# Patient Record
Sex: Female | Born: 1970
Health system: Southern US, Community
[De-identification: ages and names within clinical notes are randomized; demographics above are authoritative.]

## PROBLEM LIST (undated history)

## (undated) DIAGNOSIS — R011 Cardiac murmur, unspecified: Secondary | ICD-10-CM

## (undated) DIAGNOSIS — R112 Nausea with vomiting, unspecified: Secondary | ICD-10-CM

## (undated) DIAGNOSIS — E785 Hyperlipidemia, unspecified: Secondary | ICD-10-CM

## (undated) DIAGNOSIS — I10 Essential (primary) hypertension: Secondary | ICD-10-CM

## (undated) DIAGNOSIS — H6690 Otitis media, unspecified, unspecified ear: Secondary | ICD-10-CM

## (undated) DIAGNOSIS — G809 Cerebral palsy, unspecified: Secondary | ICD-10-CM

## (undated) DIAGNOSIS — R51 Headache: Secondary | ICD-10-CM

## (undated) DIAGNOSIS — J302 Other seasonal allergic rhinitis: Secondary | ICD-10-CM

## (undated) DIAGNOSIS — K635 Polyp of colon: Secondary | ICD-10-CM

## (undated) DIAGNOSIS — F419 Anxiety disorder, unspecified: Secondary | ICD-10-CM

## (undated) DIAGNOSIS — Z9889 Other specified postprocedural states: Secondary | ICD-10-CM

## (undated) DIAGNOSIS — T7840XA Allergy, unspecified, initial encounter: Secondary | ICD-10-CM

## (undated) HISTORY — PX: HIP SURGERY: SHX245

## (undated) HISTORY — DX: Cerebral palsy, unspecified: G80.9

## (undated) HISTORY — DX: Allergy, unspecified, initial encounter: T78.40XA

## (undated) HISTORY — DX: Polyp of colon: K63.5

## (undated) HISTORY — PX: OTHER SURGICAL HISTORY: SHX169

## (undated) HISTORY — DX: Anxiety disorder, unspecified: F41.9

## (undated) HISTORY — DX: Hyperlipidemia, unspecified: E78.5

## (undated) HISTORY — DX: Other seasonal allergic rhinitis: J30.2

## (undated) HISTORY — DX: Cardiac murmur, unspecified: R01.1

## (undated) HISTORY — PX: EYE SURGERY: SHX253

## (undated) HISTORY — PX: TONSILLECTOMY: SUR1361

## (undated) HISTORY — DX: Otitis media, unspecified, unspecified ear: H66.90

## (undated) HISTORY — PX: INCISION AND DRAINAGE ABSCESS: SHX5864

## (undated) HISTORY — PX: ABDOMINAL HYSTERECTOMY: SHX81

## (undated) HISTORY — DX: Essential (primary) hypertension: I10

---

## 1998-02-15 ENCOUNTER — Other Ambulatory Visit: Admission: RE | Admit: 1998-02-15 | Discharge: 1998-02-15 | Payer: Self-pay | Admitting: Gynecology

## 2000-04-28 ENCOUNTER — Other Ambulatory Visit: Admission: RE | Admit: 2000-04-28 | Discharge: 2000-04-28 | Payer: Self-pay | Admitting: Gynecology

## 2001-06-16 ENCOUNTER — Other Ambulatory Visit: Admission: RE | Admit: 2001-06-16 | Discharge: 2001-06-16 | Payer: Self-pay | Admitting: Gynecology

## 2002-06-20 ENCOUNTER — Other Ambulatory Visit: Admission: RE | Admit: 2002-06-20 | Discharge: 2002-06-20 | Payer: Self-pay | Admitting: Gynecology

## 2003-06-26 ENCOUNTER — Other Ambulatory Visit: Admission: RE | Admit: 2003-06-26 | Discharge: 2003-06-26 | Payer: Self-pay | Admitting: Gynecology

## 2004-07-04 ENCOUNTER — Other Ambulatory Visit: Admission: RE | Admit: 2004-07-04 | Discharge: 2004-07-04 | Payer: Self-pay | Admitting: Gynecology

## 2005-07-09 ENCOUNTER — Other Ambulatory Visit: Admission: RE | Admit: 2005-07-09 | Discharge: 2005-07-09 | Payer: Self-pay | Admitting: Gynecology

## 2005-07-09 ENCOUNTER — Encounter: Admission: RE | Admit: 2005-07-09 | Discharge: 2005-07-09 | Payer: Self-pay | Admitting: Gynecology

## 2005-07-25 ENCOUNTER — Ambulatory Visit: Payer: Self-pay | Admitting: Internal Medicine

## 2006-08-24 ENCOUNTER — Other Ambulatory Visit: Admission: RE | Admit: 2006-08-24 | Discharge: 2006-08-24 | Payer: Self-pay | Admitting: Gynecology

## 2006-10-26 ENCOUNTER — Ambulatory Visit: Payer: Self-pay | Admitting: Internal Medicine

## 2006-10-26 DIAGNOSIS — G809 Cerebral palsy, unspecified: Secondary | ICD-10-CM | POA: Insufficient documentation

## 2006-10-26 DIAGNOSIS — R011 Cardiac murmur, unspecified: Secondary | ICD-10-CM

## 2006-10-26 DIAGNOSIS — D126 Benign neoplasm of colon, unspecified: Secondary | ICD-10-CM

## 2006-10-26 DIAGNOSIS — E785 Hyperlipidemia, unspecified: Secondary | ICD-10-CM

## 2006-10-26 DIAGNOSIS — I10 Essential (primary) hypertension: Secondary | ICD-10-CM

## 2007-11-16 ENCOUNTER — Encounter: Payer: Self-pay | Admitting: Internal Medicine

## 2007-11-23 ENCOUNTER — Telehealth (INDEPENDENT_AMBULATORY_CARE_PROVIDER_SITE_OTHER): Payer: Self-pay | Admitting: *Deleted

## 2007-12-03 ENCOUNTER — Ambulatory Visit: Payer: Self-pay | Admitting: Internal Medicine

## 2007-12-03 DIAGNOSIS — N3941 Urge incontinence: Secondary | ICD-10-CM | POA: Insufficient documentation

## 2007-12-03 DIAGNOSIS — N3946 Mixed incontinence: Secondary | ICD-10-CM | POA: Insufficient documentation

## 2007-12-20 ENCOUNTER — Encounter: Payer: Self-pay | Admitting: Internal Medicine

## 2008-05-16 ENCOUNTER — Ambulatory Visit: Payer: Self-pay | Admitting: Family Medicine

## 2008-05-16 ENCOUNTER — Ambulatory Visit: Payer: Self-pay | Admitting: Internal Medicine

## 2008-05-16 ENCOUNTER — Encounter (INDEPENDENT_AMBULATORY_CARE_PROVIDER_SITE_OTHER): Payer: Self-pay | Admitting: *Deleted

## 2008-05-17 ENCOUNTER — Telehealth (INDEPENDENT_AMBULATORY_CARE_PROVIDER_SITE_OTHER): Payer: Self-pay | Admitting: *Deleted

## 2008-05-18 ENCOUNTER — Telehealth: Payer: Self-pay | Admitting: Family Medicine

## 2009-01-31 ENCOUNTER — Encounter (INDEPENDENT_AMBULATORY_CARE_PROVIDER_SITE_OTHER): Payer: Self-pay | Admitting: *Deleted

## 2009-01-31 HISTORY — PX: LAPAROSCOPIC SUPRACERVICAL HYSTERECTOMY: SUR797

## 2009-02-27 ENCOUNTER — Ambulatory Visit (HOSPITAL_BASED_OUTPATIENT_CLINIC_OR_DEPARTMENT_OTHER): Admission: RE | Admit: 2009-02-27 | Discharge: 2009-02-28 | Payer: Self-pay | Admitting: Gynecology

## 2009-04-03 ENCOUNTER — Ambulatory Visit: Payer: Self-pay | Admitting: Internal Medicine

## 2009-04-03 DIAGNOSIS — H9209 Otalgia, unspecified ear: Secondary | ICD-10-CM | POA: Insufficient documentation

## 2009-04-06 LAB — CONVERTED CEMR LAB
ALT: 17 units/L (ref 0–35)
Basophils Relative: 0.9 % (ref 0.0–3.0)
Calcium: 9 mg/dL (ref 8.4–10.5)
Cholesterol: 156 mg/dL (ref 0–200)
Creatinine, Ser: 0.8 mg/dL (ref 0.4–1.2)
Eosinophils Absolute: 0.2 10*3/uL (ref 0.0–0.7)
Hemoglobin: 14.8 g/dL (ref 12.0–15.0)
Lymphocytes Relative: 24.6 % (ref 12.0–46.0)
Lymphs Abs: 1.7 10*3/uL (ref 0.7–4.0)
MCHC: 34.1 g/dL (ref 30.0–36.0)
MCV: 85.5 fL (ref 78.0–100.0)
Monocytes Absolute: 0.4 10*3/uL (ref 0.1–1.0)
Monocytes Relative: 5.1 % (ref 3.0–12.0)
Platelets: 231 10*3/uL (ref 150.0–400.0)
RDW: 11.5 % (ref 11.5–14.6)
Sodium: 141 meq/L (ref 135–145)
Total CHOL/HDL Ratio: 4
WBC: 7.1 10*3/uL (ref 4.5–10.5)

## 2009-08-17 ENCOUNTER — Ambulatory Visit: Payer: Self-pay | Admitting: Family Medicine

## 2009-08-17 DIAGNOSIS — F329 Major depressive disorder, single episode, unspecified: Secondary | ICD-10-CM

## 2009-08-17 DIAGNOSIS — R319 Hematuria, unspecified: Secondary | ICD-10-CM

## 2009-08-17 LAB — CONVERTED CEMR LAB
Bilirubin Urine: NEGATIVE
Ketones, urine, test strip: NEGATIVE
WBC Urine, dipstick: NEGATIVE

## 2009-11-08 ENCOUNTER — Ambulatory Visit: Payer: Self-pay | Admitting: Family Medicine

## 2009-11-08 DIAGNOSIS — G43009 Migraine without aura, not intractable, without status migrainosus: Secondary | ICD-10-CM

## 2009-11-15 ENCOUNTER — Ambulatory Visit: Payer: Self-pay | Admitting: Family Medicine

## 2009-11-15 DIAGNOSIS — H6690 Otitis media, unspecified, unspecified ear: Secondary | ICD-10-CM | POA: Insufficient documentation

## 2010-03-28 ENCOUNTER — Ambulatory Visit
Admission: RE | Admit: 2010-03-28 | Discharge: 2010-03-28 | Payer: Self-pay | Source: Home / Self Care | Attending: Family Medicine | Admitting: Family Medicine

## 2010-03-28 ENCOUNTER — Encounter (INDEPENDENT_AMBULATORY_CARE_PROVIDER_SITE_OTHER): Payer: Self-pay | Admitting: *Deleted

## 2010-03-28 ENCOUNTER — Other Ambulatory Visit: Payer: Self-pay | Admitting: Family Medicine

## 2010-03-28 LAB — CBC WITH DIFFERENTIAL/PLATELET
Basophils Relative: 1.6 % (ref 0.0–3.0)
HCT: 41.8 % (ref 36.0–46.0)
Hemoglobin: 14.4 g/dL (ref 12.0–15.0)
Lymphocytes Relative: 32.8 % (ref 12.0–46.0)
Monocytes Relative: 6.2 % (ref 3.0–12.0)
Neutro Abs: 2.7 10*3/uL (ref 1.4–7.7)
RBC: 4.89 Mil/uL (ref 3.87–5.11)
RDW: 12.2 % (ref 11.5–14.6)
WBC: 4.8 10*3/uL (ref 4.5–10.5)

## 2010-03-28 LAB — BASIC METABOLIC PANEL
Calcium: 8.7 mg/dL (ref 8.4–10.5)
Chloride: 105 mEq/L (ref 96–112)
Creatinine, Ser: 0.8 mg/dL (ref 0.4–1.2)
GFR: 79.48 mL/min (ref >60.00)
Potassium: 4.4 mEq/L (ref 3.5–5.1)
Sodium: 135 mEq/L (ref 135–145)

## 2010-03-28 LAB — HEPATIC FUNCTION PANEL
AST: 14 U/L (ref 0–37)
Bilirubin, Direct: 0.1 mg/dL (ref 0.0–0.3)

## 2010-03-28 LAB — LIPID PANEL
Cholesterol: 169 mg/dL (ref 0–200)
LDL Cholesterol: 104 mg/dL — ABNORMAL HIGH (ref 0–99)
Total CHOL/HDL Ratio: 3
Triglycerides: 50 mg/dL (ref 0.0–149.0)
VLDL: 10 mg/dL (ref 0.0–40.0)

## 2010-03-28 LAB — TSH: TSH: 2.32 u[IU]/mL (ref 0.35–5.50)

## 2010-04-04 NOTE — Letter (Signed)
Summary: Depression Questionnaire  Depression Questionnaire   Imported By: Lanelle Bal 08/28/2009 11:20:55  _____________________________________________________________________  External Attachment:    Type:   Image     Comment:   External Document

## 2010-04-04 NOTE — Assessment & Plan Note (Signed)
Summary: earache worse/cbs   Vital Signs:  Patient profile:   41 year old female Height:      67 inches Weight:      182 pounds Temp:     98.6 degrees F oral Pulse rate:   80 / minute BP sitting:   118 / 78  (left arm)  Vitals Entered By: Jeremy Johann CMA (November 15, 2009 1:34 PM) CC: pain in left ear   History of Present Illness: 40 yo woman here today for continued pain in L ear.  no pain w/ manipulation of pinna, pain behind L ear.  taking amox.  subjective fevers.  using sudafed, nasal spray, antihistamine as directed.  Current Medications (verified): 1)  Triamterene-Hctz 37.5-25 Mg  Tabs (Triamterene-Hctz) .... Half By Mouth Qd 2)  Aleve .Marland Kitchen.. 1t;qd 3)  Citalopram Hydrobromide 40 Mg Tabs (Citalopram Hydrobromide) .... Take One Tablet By Mouth Daily 4)  Amoxicillin 500 Mg Tabs (Amoxicillin) .Marland Kitchen.. 1 Tab By Mouth Two Times A Day X10 Days.  Take W/ Food. 5)  Flonase 50 Mcg/act Susp (Fluticasone Propionate) .... 2 Sprays Each Nostril Daily  Allergies (verified): 1)  ! Codeine  Review of Systems      See HPI  Physical Exam  General:  alert, well-developed, and well-nourished.   Head:  Normocephalic and atraumatic without obvious abnormalities. No apparent alopecia or balding. Eyes:  No corneal or conjunctival inflammation noted. EOMI. Perrla. Funduscopic exam benign, without hemorrhages, exudates or papilledema. Vision grossly normal. Ears:  L TM markedly retracted, R TM normal.  L OM improved- no longer erythematous or opaque fluid Nose:  marked turbinate edema, L>R Mouth:  + PND Lungs:  normal respiratory effort, no intercostal retractions, and no accessory muscle use.   Heart:  normal rate, regular rhythm, and no murmur.     Impression & Recommendations:  Problem # 1:  LOM (ICD-382.9) Assessment Unchanged pt completing course of amox.  infxn component seems to be resolving.  TM remains retracted.  most likely eustachian tube dysfxn vs serous otitis.  continue  plan as discussed at previous visit.  if no improvement will refer to ENT.  pt and mom express understanding and are in agreement. Her updated medication list for this problem includes:    Amoxicillin 500 Mg Tabs (Amoxicillin) .Marland Kitchen... 1 tab by mouth two times a day x10 days.  take w/ food.  Complete Medication List: 1)  Triamterene-hctz 37.5-25 Mg Tabs (Triamterene-hctz) .... Half by mouth qd 2)  Aleve  .Marland Kitchen.. 1t;qd 3)  Citalopram Hydrobromide 40 Mg Tabs (Citalopram hydrobromide) .... Take one tablet by mouth daily 4)  Amoxicillin 500 Mg Tabs (Amoxicillin) .Marland Kitchen.. 1 tab by mouth two times a day x10 days.  take w/ food. 5)  Flonase 50 Mcg/act Susp (Fluticasone propionate) .... 2 sprays each nostril daily  Patient Instructions: 1)  If no improvement in 1-2 weeks, call me and we'll send you to ENT 2)  Continue the Flonase, sudafed, claritin/zyrtec 3)  Finish the amoxicillin 4)  Call with any questions or concerns! 5)  Hang in there!!!

## 2010-04-04 NOTE — Assessment & Plan Note (Signed)
Summary: fu on bp/kdc   Vital Signs:  Patient profile:   40 year old female Height:      67 inches Weight:      193.6 pounds BMI:     30.43 Pulse rate:   62 / minute BP sitting:   110 / 70  Vitals Entered By: Dena Billet CC: rov Comments pt. complains of stomach pain, no other symptoms pt. complains of rt ear pain x 2days needs referral for colonoscopy   History of Present Illness: htn-- has lost wt, watching diet, ambulatory BPs good   high cholesterol--  diet improved, needs blood checked   complains of stomach pain since the hysterctomy, to see Dr Nicholas Lose  04-09-09, see ROS  pt. complains of L ear pain x 2days, see ROS  needs referral for colonoscopy, history of colon polyps many years ago  Allergies: 1)  ! Codeine  Past History:  Past Medical History: Reviewed history from 12/03/2007 and no changes required. Hyperlipidemia Hypertension CARDIAC MURMUR   COLONIC POLYPS   CEREBRAL PALSY    Past Surgical History: status-post hip  surgery bilaterally, eye surgery. Hysterectomy "Supracervical hysterectomy."  (01/31/2009)   Social History: Reviewed history from 12/03/2007 and no changes required. the patient is married to another handicapped person-- they are currently separated no children  Review of Systems       denies fever no nausea or vomiting no vaginal discharge or bleeding denies ear discharge, runny nose or sore throat no dysuria hematuria  Physical Exam  General:  alert, well-developed, and well-nourished.   Ears:  R ear normal and L ear normal.   Lungs:  normal respiratory effort, no intercostal retractions, and no accessory muscle use.   Heart:  normal rate, regular rhythm, and no murmur.   Abdomen:  soft, no distention, no masses, no guarding, and no rigidity.  slightly tender in the lower abdomen without mass or rebound   Impression & Recommendations:  Problem # 1:  HYPERTENSION (ICD-401.9) well controlled, continue with present care,  labs Her updated medication list for this problem includes:    Triamterene-hctz 37.5-25 Mg Tabs (Triamterene-hctz) ..... Half by mouth qd    Metoprolol Tartrate 100 Mg Tabs (Metoprolol tartrate) ..... Half tablet twice a day for one week, then one tablet twice a day    BP today: 110/70 Prior BP: 124/86 (05/16/2008)  Orders: Venipuncture (16109) TLB-BMP (Basic Metabolic Panel-BMET) (80048-METABOL) TLB-CBC Platelet - w/Differential (85025-CBCD)  Problem # 2:  HYPERLIPIDEMIA (ICD-272.4) history of high cholesterol, on diet only diet has improved lately labs  Orders: TLB-ALT (SGPT) (84460-ALT) TLB-AST (SGOT) (84450-SGOT) TLB-Lipid Panel (80061-LIPID)  Problem # 3:  HEALTH MAINTENANCE EXAM (ICD-V70.0)  history of personal colon polyps, re-refer to GI status post recent hysterectomy, she will have to be cleared by gynecology before the procedure  Orders: Gastroenterology Referral (GI)  Problem # 4:  OTALGIA (ICD-388.70) Assessment: New ear exam normal, symptoms mild, patient to call if symptoms increase or are not better in few days  Problem # 5:  abdominal pain likely postop pain to see Dr. Nicholas Lose soon, call them immediately if the pain is worse or if she has fever  Complete Medication List: 1)  Bcp  2)  Triamterene-hctz 37.5-25 Mg Tabs (Triamterene-hctz) .... Half by mouth qd 3)  Metoprolol Tartrate 100 Mg Tabs (Metoprolol tartrate) .... Half tablet twice a day for one week, then one tablet twice a day 4)  Aleve  .Marland Kitchen.. 1t;qd 5)  Sanctura 60mg   .... 1t;qd  Patient  Instructions: 1)  Please schedule a follow-up appointment in 6 months .    Immunization History:  Influenza Immunization History:    Influenza:  historical (12/01/2008)

## 2010-04-04 NOTE — Letter (Signed)
Summary: Work Dietitian at [employer]  [address] [place], [place] [address]   Phone: [phone]  Fax: [phone]    Today's Date: March 28, 2010  Name of Patient: Holly Saunders  The above named patient had a medical visit today at:  9:00am  Please take this into consideration when reviewing the time away from work/school.    Special Instructions:  [  X] None  [  ] To be off the remainder of today, returning to the normal work / school schedule tomorrow.  [  ] To be off until the next scheduled appointment on ______________________.  [  ] Other ________________________________________________________________ ________________________________________________________________________   Sincerely yours,   Doristine Devoid CMA

## 2010-04-04 NOTE — Assessment & Plan Note (Signed)
Summary: migraine?//ear pain//lch   Vital Signs:  Patient profile:   40 year old female Weight:      184 pounds BMI:     28.92 Temp:     98.6 degrees F oral BP sitting:   130 / 86  (left arm)  Vitals Entered By: Doristine Devoid CMA (November 08, 2009 3:57 PM) CC: migraines and L ear pain more tired along w/ weakness   History of Present Illness: 40 yo woman here today w/  1) L ear pain- sxs started 'a couple days ago'.  associated sore throat.  no fevers.  hx of recurrent ear infxns.  no nasal congestion.  mild cough.  + fatigue.  2) HA- had a 'really bad' HA last night.  no nausea.  HA improved currently.  3) poor sleep- able to fall asleep but having trouble staying asleep.  mood dramatically improved since starting Citalopram.  sleep issues were present before starting SSRI, pt reports this is unchanged.  will sleep well when takes 1 of mom's benzos.  Current Medications (verified): 1)  Triamterene-Hctz 37.5-25 Mg  Tabs (Triamterene-Hctz) .... Half By Mouth Qd 2)  Aleve .Marland Kitchen.. 1t;qd 3)  Citalopram Hydrobromide 40 Mg Tabs (Citalopram Hydrobromide) .... Take One Tablet By Mouth Daily 4)  Amoxicillin 500 Mg Tabs (Amoxicillin) .Marland Kitchen.. 1 Tab By Mouth Two Times A Day X10 Days.  Take W/ Food. 5)  Flonase 50 Mcg/act Susp (Fluticasone Propionate) .... 2 Sprays Each Nostril Daily  Allergies (verified): 1)  ! Codeine  Past History:  Past Medical History: Hyperlipidemia Hypertension CARDIAC MURMUR   COLONIC POLYPS   CEREBRAL PALSY  Recurrent ear infections Seasonal allergies   Review of Systems      See HPI  Physical Exam  General:  alert, well-developed, and well-nourished.   Head:  Normocephalic and atraumatic without obvious abnormalities. No apparent alopecia or balding. no TTP over sinuses Eyes:  no injxn or inflammation Ears:  L TM dull, mildly erythematous, w/ opaque fluid behind it Nose:  marked turbinate edema Mouth:  + PND Neck:  no masses and no thyromegaly.     Lungs:  normal respiratory effort, no intercostal retractions, and no accessory muscle use.   Heart:  normal rate, regular rhythm, and no murmur.   Psych:  very worked up in office about her depression- feels this is something that should be better.  doesn't want to be depressed but doesn't know what to do.   Impression & Recommendations:  Problem # 1:  LOM (ICD-382.9) Assessment New  pt's exam and hx consistent w/ L OM.  start amox.  likely the congestion has an allergy component- start Flonase, continue OTC antihistamine, add sudafed.  reviewed supportive care and red flags that should prompt return.  Pt expresses understanding and is in agreement w/ this plan. Her updated medication list for this problem includes:    Amoxicillin 500 Mg Tabs (Amoxicillin) .Marland Kitchen... 1 tab by mouth two times a day x10 days.  take w/ food.  Orders: Prescription Created Electronically [phone])  Problem # 2:  DEPRESSIVE DISORDER (ICD-311) Assessment: Improved pt and mom report sxs much improved since starting citalopram.  still w/ sleep issues.  pt to continue citalopram but will increase dose.  will follow closely. Her updated medication list for this problem includes:    Citalopram Hydrobromide 40 Mg Tabs (Citalopram hydrobromide) .Marland Kitchen... Take one tablet by mouth daily  Problem # 3:  HEADACHE (ICD-784.0) Assessment: New most likely due to L OM.  no  red flags on hx or PE.  reviewed supportive care and red flags that should prompt return.  Pt expresses understanding and is in agreement w/ this plan.  Complete Medication List: 1)  Triamterene-hctz 37.5-25 Mg Tabs (Triamterene-hctz) .... Half by mouth qd 2)  Aleve  .Marland Kitchen.. 1t;qd 3)  Citalopram Hydrobromide 40 Mg Tabs (Citalopram hydrobromide) .... Take one tablet by mouth daily 4)  Amoxicillin 500 Mg Tabs (Amoxicillin) .Marland Kitchen.. 1 tab by mouth two times a day x10 days.  take w/ food. 5)  Flonase 50 Mcg/act Susp (Fluticasone propionate) .... 2 sprays each nostril  daily  Patient Instructions: 1)  Follow up in 1 month to follow up your depression 2)  Your headache is most likely due to your ear infection 3)  Take the Amoxicillin as directed for your ear infection- take w/ food to avoid upset stomach 4)  Use the Nasonex to decrease your allergy congestion 5)  Increase the Citalopram to 40mg  daily- you have a new prescription  waiting for you 6)  Your poor sleep is most likely due to the depression 7)  Hang in there!! Prescriptions: FLONASE 50 MCG/ACT SUSP (FLUTICASONE PROPIONATE) 2 sprays each nostril daily  #1 x 3   Entered and Authorized by:   Neena Rhymes MD   Signed by:   Neena Rhymes MD on 11/08/2009   Method used:   Electronically to        [facility] # [phone]* (retail)       [address] [place], [place]  [address]       Ph: [phone]       Fax: [phone]   RxID:   [id number] AMOXICILLIN 500 MG TABS (AMOXICILLIN) 1 tab by mouth two times a day x10 days.  take w/ food.  #20 x 0   Entered and Authorized by:   Neena Rhymes MD   Signed by:   Neena Rhymes MD on 11/08/2009   Method used:   Electronically to        [facility] # [phone]* (retail)       [address] [place], [place]  [address]       Ph: [phone]       Fax: [phone]   RxID:   [id number] TRIAMTERENE-HCTZ 37.5-25 MG  TABS (TRIAMTERENE-HCTZ) half by mouth QD  #15 x 6   Entered and Authorized by:   Neena Rhymes MD   Signed by:   Neena Rhymes MD on 11/08/2009   Method used:   Electronically to        [facility] # [phone]* (retail)       [address] [place], [place]  [address]       Ph: [phone]       Fax: [phone]   RxID:   [id number] CITALOPRAM HYDROBROMIDE 40 MG TABS (CITALOPRAM HYDROBROMIDE) take one tablet by mouth daily  #30 x 3   Entered and Authorized by:   Neena Rhymes MD   Signed by:   Neena Rhymes MD  on 11/08/2009   Method used:   Electronically to        [facility] [facility] [address] [phone]* (retail)       [address]       [place], [place]  [address]       Ph: [phone]       Fax: [phone]   RxID:   [id number]

## 2010-04-04 NOTE — Assessment & Plan Note (Signed)
Summary: depression//kn   Vital Signs:  Patient profile:   40 year old female Height:      67 inches Weight:      193 pounds BMI:     30.34 O2 Sat:      96 % on Room air Pulse rate:   70 / minute BP sitting:   120 / 74  (left arm) Cuff size:   large  Vitals Entered By: Payton Spark CMA (August 17, 2009 3:22 PM)  O2 Flow:  Room air CC: Depression   History of Present Illness: 40 yo woman here today for depression.  PHQ score of 15.  pt reports difficulty sleeping, will become tearful, irritable.  eating poorly.  going through divorce, having difficulty at work b/c ex is calling work and talking w/ a Radio broadcast assistant.  has been in counseling.  going to court for alimony.  also notes 2 days last week of blood on the toilet tissue when wiping.  no dysuria, frequency, urgency.  s/p hysterectomy but ovaries remain.  hx of ovarian cysts.  no pain.  no persistant sxs.  Current Medications (verified): 1)  Triamterene-Hctz 37.5-25 Mg  Tabs (Triamterene-Hctz) .... Half By Mouth Qd 2)  Metoprolol Tartrate 100 Mg  Tabs (Metoprolol Tartrate) .Marland Kitchen.. 1 By Mouth Bid 3)  Aleve .Marland Kitchen.. 1t;qd  Allergies (verified): 1)  ! Codeine  Past History:  Past Medical History: Reviewed history from 12/03/2007 and no changes required. Hyperlipidemia Hypertension CARDIAC MURMUR   COLONIC POLYPS   CEREBRAL PALSY    Past Surgical History: Reviewed history from 04/03/2009 and no changes required. status-post hip  surgery bilaterally, eye surgery. Hysterectomy "Supracervical hysterectomy."  (01/31/2009)   Social History: divorced no children  Review of Systems      See HPI  Physical Exam  General:  alert, well-developed, and well-nourished.   Abdomen:  no suprapubic or CVA tenderness Psych:  Cognition and judgment appear intact. Alert and cooperative with normal attention span and concentration. No apparent delusions, illusions, hallucinations   Impression & Recommendations:  Problem # 1:  DEPRESSIVE  DISORDER (ICD-311) Assessment New  PHQ score places pt in severely depressed category.  recommended therapy and will start SSRI.  as SSRIs all interact w/ metoprolol will have pt stop metoprolol and reassess BP at next visit.  pt denies SI/HI Her updated medication list for this problem includes:    Citalopram Hydrobromide 20 Mg Tabs (Citalopram hydrobromide) .Marland Kitchen... Take one tablet by mouth daily  Orders: Prescription Created Electronically [phone])  Problem # 2:  HEMATURIA (ICD-599.70) Assessment: New UA completely clear today.  possible that blood was from a ruptured ovarian cyst.  if sxs return will evaluate w/ pelvic US or urology eval.  Complete Medication List: 1)  Triamterene-hctz 37.5-25 Mg Tabs (Triamterene-hctz) .... Half by mouth qd 2)  Aleve  .Marland Kitchen.. 1t;qd 3)  Citalopram Hydrobromide 20 Mg Tabs (Citalopram hydrobromide) .... Take one tablet by mouth daily  Other Orders: UA Dipstick w/o Micro (automated)  (81003)  Patient Instructions: 1)  Please schedule a follow-up appointment in 1 month to recheck mood and blood pressure. 2)  STOP the metoprolol 3)  START the citalopram 4)  Please consider counseling to help you deal with all of this 5)  Call with any questions or concerns 6)  Hang in there!!  Prescriptions: CITALOPRAM HYDROBROMIDE 20 MG TABS (CITALOPRAM HYDROBROMIDE) take one tablet by mouth daily  #30 x 3   Entered and Authorized by:   Neena Rhymes MD   Signed by:  Neena Rhymes MD on 08/17/2009   Method used:   Electronically to        [facility] [phone]* (retail)       [address]       [place], [place]  [address]       Ph: [phone]       Fax: [phone]   RxID:   [id number]   Laboratory Results   Urine Tests    Routine Urinalysis   Color: yellow Appearance: Clear Glucose: negative   (Normal Range: Negative) Bilirubin: negative   (Normal Range: Negative) Ketone: negative   (Normal Range: Negative) Spec. Gravity: 1.010    (Normal Range: 1.003-1.035) Blood: negative   (Normal Range: Negative) pH: 5.0   (Normal Range: 5.0-8.0) Protein: negative   (Normal Range: Negative) Urobilinogen: 0.2   (Normal Range: 0-1) Nitrite: negative   (Normal Range: Negative) Leukocyte Esterace: negative   (Normal Range: Negative)

## 2010-04-10 NOTE — Assessment & Plan Note (Signed)
Summary: rov possibly needs bloodwork/nta   Vital Signs:  Patient profile:   40 year old female Weight:      188 pounds BMI:     29.55 Pulse rate:   70 / minute BP sitting:   120 / 80  (left arm)  Vitals Entered By: Doristine Devoid CMA (March 28, 2010 8:48 AM) CC: f/u and fasting labs    History of Present Illness: 40 yo woman here today for  1) Hyperlipidemia- due for labs.  not currently on meds.  attempting to lose weight.  has made great progress since 2008 ( ~40lbs)  2) Insomnia- taking her depression meds regularly but still waking up around 3:30 and is unable to get back to sleep.  stressed about starting new job.  has been crying again.  sxs started again at Christmas when she thought she was losing her job.  pt crying in office- 'maybe i haven't really dealt w/ any of this stuff'.  stuff includes divorce, cousin's death (they were very close), dad's stroke (fears being left alone), work issues.  will try and express her feelings but then apologize b/c she feels they're 'not coming out right'.  embarrassed by her tears, apologizes for these also.  has not had counseling.  3) HTN- pt would like to stop BP meds if possible.  denies CP, SOB, edema.    Current Medications (verified): 1)  Triamterene-Hctz 37.5-25 Mg  Tabs (Triamterene-Hctz) .... Half By Mouth Qd 2)  Aleve .Marland Kitchen.. 1t;qd 3)  Citalopram Hydrobromide 40 Mg Tabs (Citalopram Hydrobromide) .... Take One Tablet By Mouth Daily 4)  Flonase 50 Mcg/act Susp (Fluticasone Propionate) .... 2 Sprays Each Nostril Daily  Allergies (verified): 1)  ! Codeine  Past History:  Past medical, surgical, family and social histories (including risk factors) reviewed for relevance to current acute and chronic problems.  Past Medical History: Reviewed history from 11/08/2009 and no changes required. Hyperlipidemia Hypertension CARDIAC MURMUR   COLONIC POLYPS   CEREBRAL PALSY  Recurrent ear infections Seasonal allergies   Past  Surgical History: Reviewed history from 04/03/2009 and no changes required. status-post hip  surgery bilaterally, eye surgery. Hysterectomy "Supracervical hysterectomy."  (01/31/2009)   Family History: Reviewed history from 10/26/2006 and no changes required. no history of breast or colon cancer. No history of heart disease. Mother has hypertension, high cholesterol and colon polyps.  Social History: Reviewed history from 08/17/2009 and no changes required. divorced no children  Review of Systems      See HPI  Physical Exam  General:  tearful throughout visit Neck:  no masses and no thyromegaly.   Lungs:  normal respiratory effort, no intercostal retractions, and no accessory muscle use.   Heart:  normal rate, regular rhythm, and no murmur.   Psych:  tearful, apologetic for tears.  unable to put her feelings into 'the right words'.   Impression & Recommendations:  Problem # 1:  DEPRESSIVE DISORDER (ICD-311) Assessment Deteriorated pt obviously upset today.  in talking w/ her it is clear that she has not come to terms w/ many recent events- despite her thinking that she has.  discussed w/ both pt and her mother that increasing or changing meds will only go so far- that she must deal w/ these emotions that she's been suppressing or things will not improve.  names and #s of counselors provided.  total time spent w/ pt- 28 minutes, >50% spent counseling. Her updated medication list for this problem includes:    Citalopram Hydrobromide 40  Mg Tabs (Citalopram hydrobromide) .Marland Kitchen... Take one tablet by mouth daily  Problem # 2:  HYPERLIPIDEMIA (ICD-272.4) Assessment: Unchanged due for labs.  start meds if needed. Orders: Venipuncture (11914) Specimen Handling (78295) TLB-Lipid Panel (80061-LIPID) TLB-Hepatic/Liver Function Pnl (80076-HEPATIC)  Problem # 3:  HYPERTENSION (ICD-401.9) Assessment: Unchanged hold BP meds and re-assess at upcoming CPE.  if BP increases will need to  start meds.  Pt expresses understanding and is in agreement w/ this plan. Her updated medication list for this problem includes:    Triamterene-hctz 37.5-25 Mg Tabs (Triamterene-hctz) ..... Half by mouth qd  Orders: Specimen Handling (62130) TLB-BMP (Basic Metabolic Panel-BMET) (80048-METABOL) TLB-CBC Platelet - w/Differential (85025-CBCD) TLB-TSH (Thyroid Stimulating Hormone) (84443-TSH)  Complete Medication List: 1)  Triamterene-hctz 37.5-25 Mg Tabs (Triamterene-hctz) .... Half by mouth qd 2)  Aleve  .Marland Kitchen.. 1t;qd 3)  Citalopram Hydrobromide 40 Mg Tabs (Citalopram hydrobromide) .... Take one tablet by mouth daily 4)  Flonase 50 Mcg/act Susp (Fluticasone propionate) .... 2 sprays each nostril daily  Patient Instructions: 1)  Schedule your complete physical at your convenience 2)  We'll notify you of your lab results 3)  Please start counseling- this is very important!  You won't sleep or feel better until you deal with some of these emotions! 4)  Hang in there!   Orders Added: 1)  Venipuncture [86578] 2)  Specimen Handling [99000] 3)  TLB-Lipid Panel [80061-LIPID] 4)  TLB-Hepatic/Liver Function Pnl [80076-HEPATIC] 5)  TLB-BMP (Basic Metabolic Panel-BMET) [80048-METABOL] 6)  TLB-CBC Platelet - w/Differential [85025-CBCD] 7)  TLB-TSH (Thyroid Stimulating Hormone) [84443-TSH] 8)  Est. Patient Level IV [46962]

## 2010-06-03 LAB — HEMOGLOBIN AND HEMATOCRIT, BLOOD: Hemoglobin: 13.4 g/dL (ref 12.0–15.0)

## 2010-06-08 ENCOUNTER — Other Ambulatory Visit: Payer: Self-pay | Admitting: Family Medicine

## 2010-06-10 NOTE — Telephone Encounter (Signed)
Last ov- 03/28/10, last filled 11/08/09 30 x 3.

## 2010-08-13 ENCOUNTER — Encounter: Payer: Self-pay | Admitting: Family Medicine

## 2010-08-16 ENCOUNTER — Ambulatory Visit: Payer: Self-pay | Admitting: Family Medicine

## 2011-03-05 ENCOUNTER — Ambulatory Visit (INDEPENDENT_AMBULATORY_CARE_PROVIDER_SITE_OTHER): Payer: Medicare Other | Admitting: Family Medicine

## 2011-03-05 ENCOUNTER — Encounter: Payer: Self-pay | Admitting: Family Medicine

## 2011-03-05 DIAGNOSIS — H6690 Otitis media, unspecified, unspecified ear: Secondary | ICD-10-CM

## 2011-03-05 DIAGNOSIS — J329 Chronic sinusitis, unspecified: Secondary | ICD-10-CM

## 2011-03-05 MED ORDER — PROMETHAZINE-DM 6.25-15 MG/5ML PO SYRP: 5.0000 mL | ORAL_SOLUTION | Freq: Four times a day (QID) | ORAL | Status: AC | PRN

## 2011-03-05 MED ORDER — BENZONATATE 200 MG PO CAPS: 200.0000 mg | ORAL_CAPSULE | Freq: Three times a day (TID) | ORAL | Status: AC | PRN

## 2011-03-05 MED ORDER — AMOXICILLIN 875 MG PO TABS: 875.0000 mg | ORAL_TABLET | Freq: Two times a day (BID) | ORAL | Status: AC

## 2011-03-05 NOTE — Patient Instructions (Signed)
This is a sinus/ear infection Start the Amoxicillin as directed- take w/ food to avoid upset stomach Use the Tessalon for daytime cough and syrup for night time Add Mucinex to thin your congestion Drink plenty of fluids REST! Hang in there!!! Happy New Year!

## 2011-03-05 NOTE — Progress Notes (Signed)
  Subjective:    Patient ID: Holly Saunders, female    DOB: October 26, 1970, 41 y.o.   MRN: [id number]  HPI Cough- sxs started early Monday.  Cough is productive white/yellow sputum.  No fevers.  Alternating chills and sweats.  + nasal congestion.  L ear pain.  Mild facial pressure.  + sick contacts.  + sore throat.   Review of Systems For ROS see HPI     Objective:   Physical Exam  Vitals reviewed. Constitutional: She appears well-developed and well-nourished. No distress.  HENT:  Head: Normocephalic and atraumatic.  Right Ear: Tympanic membrane normal.  Left Ear: Tympanic membrane is injected, scarred, erythematous and bulging.  Nose: Mucosal edema and rhinorrhea present. Right sinus exhibits maxillary sinus tenderness and frontal sinus tenderness. Left sinus exhibits maxillary sinus tenderness and frontal sinus tenderness.  Mouth/Throat: Uvula is midline and mucous membranes are normal. Posterior oropharyngeal erythema present. No oropharyngeal exudate.  Eyes: Conjunctivae and EOM are normal. Pupils are equal, round, and reactive to light.  Neck: Normal range of motion. Neck supple.  Cardiovascular: Normal rate, regular rhythm and normal heart sounds.   Pulmonary/Chest: Effort normal and breath sounds normal. No respiratory distress. She has no wheezes.  Lymphadenopathy:    She has no cervical adenopathy.          Assessment & Plan:

## 2011-03-16 NOTE — Assessment & Plan Note (Signed)
New.  Pt's hx and PE consistent w/ infxn.  Start abx.  Reviewed supportive care and red flags that should prompt return.  Pt expressed understanding and is in agreement w/ plan.

## 2011-03-16 NOTE — Assessment & Plan Note (Signed)
Recurrent.  Start abx.  Reviewed supportive care and red flags that should prompt return.  Pt expressed understanding and is in agreement w/ plan.  

## 2011-05-28 ENCOUNTER — Encounter: Payer: Self-pay | Admitting: Internal Medicine

## 2011-05-28 ENCOUNTER — Ambulatory Visit (INDEPENDENT_AMBULATORY_CARE_PROVIDER_SITE_OTHER): Payer: Medicare Other | Admitting: Internal Medicine

## 2011-05-28 VITALS — BP 124/86 | HR 76 | Temp 98.9°F | Wt 197.8 lb

## 2011-05-28 DIAGNOSIS — J209 Acute bronchitis, unspecified: Secondary | ICD-10-CM

## 2011-05-28 DIAGNOSIS — J029 Acute pharyngitis, unspecified: Secondary | ICD-10-CM

## 2011-05-28 DIAGNOSIS — J069 Acute upper respiratory infection, unspecified: Secondary | ICD-10-CM

## 2011-05-28 MED ORDER — CEFUROXIME AXETIL 500 MG PO TABS: 500.0000 mg | ORAL_TABLET | Freq: Two times a day (BID) | ORAL | Status: AC

## 2011-05-28 NOTE — Patient Instructions (Signed)
Plain Mucinex for thick secretions ;force NON dairy fluids . Use a Neti pot daily as needed for sinus congestion. Nasal cleansing in the shower as discussed. Make sure that all residual soap is removed to prevent irritation.Fluticasone  1 spray in each nostril twice a day as needed. Use the "crossover" technique as discussed

## 2011-05-28 NOTE — Progress Notes (Signed)
  Subjective:    Patient ID: Holly Saunders, female    DOB: Jan 19, 1971, 41 y.o.   MRN: [id number]  HPI Respiratory tract infection Onset/symptoms:05/25/11 as ear pressure Exposures (illness/environmental/extrinsic):no Progression of symptoms:to head congestion with headache Treatments/response:Nyquil , Dayquil, ASA with slight benefit Present symptoms: Fever/chills/sweats:no Frontal headache:yes Facial pain:yes Nasal purulence:clear Sore throat:yes Dental pain:no Lymphadenopathy:no Wheezing/shortness of breath:no Cough/sputum/hemoptysis:yellow sputum Associated extrinsic/allergic symptoms:itchy eyes/ sneezing:yes Past medical history: Seasonal allergies: yes/asthma:no Smoking history:never          Review of Systems     Objective:   Physical Exam General appearance:good health ;well nourished; no acute distress or increased work of breathing is present.  No  lymphadenopathy about the head, neck, or axilla noted.   Eyes: No conjunctival inflammation or lid edema is present.   Ears:  External ear exam shows no significant lesions or deformities.  Otoscopic examination reveals clear canals, tympanic membranes are intact bilaterally & dull without bulging, retraction, inflammation or discharge.  Nose:  External nasal examination shows no deformity or inflammation. Nasal mucosa are dry without lesions or exudates. No septal dislocation or deviation.No obstruction to airflow.   Oral exam: Dental hygiene is good; lips and gums are healthy appearing.There is no oropharyngeal erythema or exudate noted but oropharynx is crowded ( Mother denies sleep apnea).   Neck:  No deformities, thyromegaly, masses but some  tenderness noted to palpation.    Heart:  Normal rate and regular rhythm. S1 and S2 normal without gallop,  click, rub .Grade 1/6 systolic murmur  Lungs:Chest clear to auscultation; no wheezes, rhonchi,rales ,or rubs present.No increased work of breathing.    Extremities:  No  cyanosis, edema, or clubbing  noted    Skin: Warm & dry           Assessment & Plan:  #1 acute bronchitis w/o bronchospasm #2 URI Plan: See orders and recommendations

## 2011-06-17 ENCOUNTER — Telehealth: Payer: Self-pay | Admitting: Internal Medicine

## 2011-06-17 NOTE — Telephone Encounter (Signed)
Pt's mother called requesting that we refill citalopram 40mg  for the pt. They have called [facility] several times, but I don't see where we received a request.

## 2011-06-17 NOTE — Telephone Encounter (Signed)
.  left message to have patient return my call to clarify whom her PCP is per noted pt has seen multiple providers in practice and unable to determine,

## 2011-06-18 MED ORDER — CITALOPRAM HYDROBROMIDE 40 MG PO TABS: 40.0000 mg | ORAL_TABLET | Freq: Every day | ORAL | Status: DC

## 2011-06-18 NOTE — Telephone Encounter (Signed)
Called pt mother number noted in designated party information as  [phone] per all other numbers noted disconnected, changed home number in chart and spoke to mother to verify pt rx needs to be sent to [facility] in [place], sent medication to [facility] via escribe per MD Beverely Low verbal order to do so

## 2011-07-08 ENCOUNTER — Ambulatory Visit (INDEPENDENT_AMBULATORY_CARE_PROVIDER_SITE_OTHER): Payer: Medicare Other | Admitting: Family Medicine

## 2011-07-08 ENCOUNTER — Encounter: Payer: Self-pay | Admitting: Family Medicine

## 2011-07-08 VITALS — BP 125/82 | HR 73 | Temp 98.3°F | Ht 66.0 in | Wt 199.6 lb

## 2011-07-08 DIAGNOSIS — R1011 Right upper quadrant pain: Secondary | ICD-10-CM | POA: Insufficient documentation

## 2011-07-08 DIAGNOSIS — R51 Headache: Secondary | ICD-10-CM

## 2011-07-08 DIAGNOSIS — R110 Nausea: Secondary | ICD-10-CM

## 2011-07-08 MED ORDER — PROMETHAZINE HCL 25 MG/ML IJ SOLN
25.0000 mg | Freq: Once | INTRAMUSCULAR | Status: AC
  Administered 2011-07-08: 25 mg via INTRAMUSCULAR

## 2011-07-08 NOTE — Progress Notes (Signed)
  Subjective:    Patient ID: Holly Saunders, female    DOB: 11-23-1970, 41 y.o.   MRN: [id number]  HPI Vomiting/abdominal pain- developed epigastric abdominal pain yesterday.  No fevers.  Last vomited at 3:30 am.  Mom reports epigastric pain has been ongoing x1 yr.  Cannot tolerate spicy foods- pain will last 2-3 days.  Starting to occur more frequently.  No diarrhea.  No fevers.  Pain w/ palpation.  Yesterday pain radiated to R shoulder.  Migraine- started on Sunday, took migraine med w/ relief but HA returned yesterday.  Was worse.  Had sensitivity to light.  Began vomiting.  Mom feels HA was brought on by severity of abdominal pain.   Review of Systems For ROS see HPI     Objective:   Physical Exam  Vitals reviewed. Constitutional: She appears well-developed and well-nourished.  Cardiovascular: Normal rate, regular rhythm and normal heart sounds.   No murmur heard. Pulmonary/Chest: Effort normal and breath sounds normal. No respiratory distress. She has no wheezes. She has no rales.  Abdominal: Soft. Bowel sounds are normal. She exhibits no distension and no mass. There is tenderness (over epigastrum and RUQ). There is rebound and guarding.          Assessment & Plan:

## 2011-07-08 NOTE — Assessment & Plan Note (Signed)
New.  Severe.  + nausea/vomiting.  Pain radiated to R shoulder yesterday- consistent w/ cholecystitis.  Discussed options- outpt workup w/ labs here, US/CT at [facility] and then likely a surgical appt and then scheduling surgery vs going to ER at [facility] (mom's preference) and having everything done at once.  Due to pt's MR/CP and dependence on mom for transportation to appts it would be easier for family to go to ER and have w/u initiated from there.  Pt given phenergan in office and will go to ER w/ mom.

## 2011-07-08 NOTE — Assessment & Plan Note (Signed)
Deteriorated.  Pt w/ hx of migraines.  Again having sxs.  Likely brought on by physical stress.  Phenergan given in office.  Pt to go to ER for complete evaluation and tx.

## 2011-07-08 NOTE — Patient Instructions (Signed)
Go to ER for complete evaluation and tx

## 2012-03-26 ENCOUNTER — Other Ambulatory Visit: Payer: Self-pay | Admitting: Family Medicine

## 2012-03-26 NOTE — Telephone Encounter (Signed)
REFILL CELEXA 40 MG Take 1 tablet (40 mg total) by mouth daily #30, LAST FILL NOT LISTED

## 2012-03-29 NOTE — Telephone Encounter (Signed)
Please advise on RF request.  Last OV:07-08-11.//AB/CMA

## 2012-03-30 MED ORDER — CITALOPRAM HYDROBROMIDE 40 MG PO TABS: 40.0000 mg | ORAL_TABLET | Freq: Every day | ORAL | Status: DC

## 2012-03-30 NOTE — Telephone Encounter (Signed)
Rx sent to the pharmacy by e-script.//AB/CMA 

## 2012-03-30 NOTE — Telephone Encounter (Signed)
Ok for celexa #30, 6 refills

## 2012-09-10 ENCOUNTER — Emergency Department (INDEPENDENT_AMBULATORY_CARE_PROVIDER_SITE_OTHER)
Admission: EM | Admit: 2012-09-10 | Discharge: 2012-09-10 | Disposition: A | Discharge status: Home / Self Care | Payer: PRIVATE HEALTH INSURANCE | Source: Home / Self Care | Attending: Family Medicine | Admitting: Family Medicine

## 2012-09-10 ENCOUNTER — Encounter: Payer: Self-pay | Admitting: Emergency Medicine

## 2012-09-10 DIAGNOSIS — J069 Acute upper respiratory infection, unspecified: Secondary | ICD-10-CM

## 2012-09-10 MED ORDER — AMOXICILLIN 875 MG PO TABS: 875.0000 mg | ORAL_TABLET | Freq: Two times a day (BID) | ORAL | Status: DC

## 2012-09-10 MED ORDER — BENZONATATE 100 MG PO CAPS: ORAL_CAPSULE | ORAL | Status: DC

## 2012-09-10 NOTE — ED Provider Notes (Signed)
History    CSN: [id number] Arrival date & time 09/10/12  1432  First MD Initiated Contact with Patient 09/10/12 1514     Chief Complaint  Patient presents with  . Cough      HPI Comments: Patient complains of 5 day history of URI symptoms with sore throat, headache, sinus congestion, and cough.  She has had a mild right earache for 3 days.  She had fever initially. She has a past history of frequent otitis media, and ear tubes when a child.  The history is provided by the patient and a parent.   Past Medical History  Diagnosis Date  . Hyperlipidemia   . Hypertension   . Cardiac murmur   . Colon polyps   . Cerebral palsy   . Seasonal allergies   . Ear infection     recurrent   Past Surgical History  Procedure Laterality Date  . Hip surgery      bialteral  . Eye surgery    . Laparoscopic supracervical hysterectomy  01/31/09   Family History  Problem Relation Age of Onset  . Hypertension Mother   . Hyperlipidemia Mother   . Colon polyps Mother    History  Substance Use Topics  . Smoking status: Never Smoker   . Smokeless tobacco: Not on file  . Alcohol Use: Yes     Comment: occasionally   OB History   Grav Para Term Preterm Abortions TAB SAB Ect Mult Living                 Review of Systems + sore throat + cough No pleuritic pain No wheezing + nasal congestion + post-nasal drainage No sinus pain/pressure No itchy/red eyes + right earache No hemoptysis No SOB + fever, + chills, resolved No nausea No vomiting No abdominal pain No diarrhea No urinary symptoms No skin rashes + fatigue + myalgias + headache Used OTC meds without relief  Allergies  Codeine  Home Medications   Current Outpatient Rx  Name  Route  Sig  Dispense  Refill  . amoxicillin (AMOXIL) 875 MG tablet   Oral   Take 1 tablet (875 mg total) by mouth 2 (two) times daily.   20 tablet   0   . benzonatate (TESSALON) 100 MG capsule      Take one cap at bedtime as necessary  for cough   12 capsule   0   . citalopram (CELEXA) 40 MG tablet   Oral   Take 1 tablet (40 mg total) by mouth daily.   30 tablet   6   . fluticasone (FLONASE) 50 MCG/ACT nasal spray   Nasal   Place 2 sprays into the nose daily.           . naproxen sodium (ANAPROX) 220 MG tablet   Oral   Take 220 mg by mouth as needed.           . triamterene-hydrochlorothiazide (MAXZIDE-25) 37.5-25 MG per tablet   Oral   Take 0.5 tablets by mouth daily.            BP 147/90  Pulse 106  Temp(Src) 98.3 F (36.8 C) (Oral)  Ht 5\' 7"  (1.702 m)  Wt 199 lb (90.266 kg)  BMI 31.16 kg/m2  SpO2 95% Physical Exam Nursing notes and Vital Signs reviewed. Appearance:  Patient appears stated age, and in no acute distress.  Patient is obese (BMI 31.2) Eyes:  Pupils are equal, round, and reactive to light and  accomodation.  Extraocular movement is intact.  Conjunctivae are not inflamed  Ears:  Canals normal.  Tympanic membranes normal.  Nose:  Congested turbinates, worse on the left.  No sinus tenderness.  Pharynx:  Normal Neck:  Supple.   Tender shotty posterior nodes are palpated bilaterally  Lungs:  Clear to auscultation.  Breath sounds are equal. Chest:  Distinct tenderness to palpation over the mid-sternum.   Heart:  Regular rate and rhythm without murmurs, rubs, or gallops.  Abdomen:  Nontender without masses or hepatosplenomegaly.  Bowel sounds are present.  No CVA or flank tenderness.  Extremities:  No edema.  No calf tenderness Skin:  No rash present.   ED Course  Procedures  none   1. Acute upper respiratory infections of unspecified site; suspect viral URI     MDM  With a past history of frequent otitis media, begin amoxicillin Prescription written for Benzonatate (Tessalon) to take at bedtime for night-time cough.  Take Mucinex D (guaifenesin with decongestant) twice daily for congestion (or may take plain Mucinex and add Sudafed as needed for congestion).  Increase fluid  intake, rest. May use Afrin nasal spray (or generic oxymetazoline) twice daily for about 5 days.  Also recommend using saline nasal spray several times daily and saline nasal irrigation (AYR is a common brand) Stop all antihistamines for now, and other non-prescription cough/cold preparations. May take Ibuprofen 200mg , 4 tabs every 8 hours with food for chest/sternum discomfort.     Holly Haw, MD 09/10/12 [phone]

## 2012-09-10 NOTE — ED Notes (Signed)
Cough, headache, runny nose, rt earache x 6 days

## 2012-10-27 ENCOUNTER — Ambulatory Visit (INDEPENDENT_AMBULATORY_CARE_PROVIDER_SITE_OTHER): Payer: PRIVATE HEALTH INSURANCE | Admitting: Family Medicine

## 2012-10-27 ENCOUNTER — Encounter: Payer: Self-pay | Admitting: Family Medicine

## 2012-10-27 VITALS — BP 130/82 | HR 80 | Temp 98.4°F | Ht 66.0 in | Wt 200.2 lb

## 2012-10-27 DIAGNOSIS — G809 Cerebral palsy, unspecified: Secondary | ICD-10-CM

## 2012-10-27 DIAGNOSIS — Z0000 Encounter for general adult medical examination without abnormal findings: Secondary | ICD-10-CM

## 2012-10-27 DIAGNOSIS — E785 Hyperlipidemia, unspecified: Secondary | ICD-10-CM

## 2012-10-27 DIAGNOSIS — Z23 Encounter for immunization: Secondary | ICD-10-CM

## 2012-10-27 NOTE — Patient Instructions (Signed)
Follow up in 1 year or as needed We'll notify you of your lab results and make any changes if needed Call and schedule your mammo Call with any questions or concerns Happy Labor Day!

## 2012-10-27 NOTE — Progress Notes (Signed)
  Subjective:    Patient ID: Holly Saunders, female    DOB: [birth year], 42 y.o.   MRN: [id number]  HPI Here today for CPE.  Risk Factors: Hyperlipidemia- chronic problem, not currently on meds.  Attempting to control w/ healthy diet CP- spastic diplegia, not following w/ neuro Physical Activity: exercising regularly Depression: no current sxs, stopped Celexa Hearing: normal to conversational tones and whispered voice ADL's: independent Cognitive: mild MR Home Safety: safe at home, lives w/ parents Height, Weight, BMI, Visual Acuity: see vitals, vision corrected to 20/20 w/ glasses Counseling: UTD on GYN Labs Ordered: See A&P Care Plan: See A&P    Review of Systems Patient reports no vision/ hearing changes, adenopathy,fever, weight change,  persistant/recurrent hoarseness , swallowing issues, chest pain, palpitations, edema, persistant/recurrent cough, hemoptysis, dyspnea (rest/exertional/paroxysmal nocturnal), gastrointestinal bleeding (melena, rectal bleeding), abdominal pain, significant heartburn, bowel changes, GU symptoms (dysuria, hematuria, incontinence), Gyn symptoms (abnormal  bleeding, pain),  syncope, focal weakness, memory loss, numbness & tingling, skin/hair/nail changes, abnormal bruising or bleeding, anxiety, or depression.     Objective:   Physical Exam General Appearance:    Alert, cooperative, no distress, appears stated age  Head:    Normocephalic, without obvious abnormality, atraumatic  Eyes:    PERRL, conjunctiva/corneas clear, EOM's intact, fundi    benign, both eyes  Ears:    Normal TM's and external ear canals, both ears  Nose:   Nares normal, septum midline, mucosa normal, no drainage    or sinus tenderness  Throat:   Lips, mucosa, and tongue normal; teeth and gums normal  Neck:   Supple, symmetrical, trachea midline, no adenopathy;    Thyroid: no enlargement/tenderness/nodules  Back:     Symmetric, no curvature, ROM normal, no CVA tenderness  Lungs:      Clear to auscultation bilaterally, respirations unlabored  Chest Wall:    No tenderness or deformity   Heart:    Regular rate and rhythm, S1 and S2 normal, no murmur, rub   or gallop  Breast Exam:    Deferred to GYN  Abdomen:     Soft, non-tender, bowel sounds active all four quadrants,    no masses, no organomegaly  Genitalia:    Deferred to GYN  Rectal:    Extremities:   Extremities normal, atraumatic, no cyanosis or edema  Pulses:   2+ and symmetric all extremities  Skin:   Skin color, texture, turgor normal, no rashes or lesions  Lymph nodes:   Cervical, supraclavicular, and axillary nodes normal  Neurologic:   CNII-XII intact,  + spasticity and increased reflexes throughout          Assessment & Plan:

## 2012-10-28 LAB — HEPATIC FUNCTION PANEL
AST: 15 U/L (ref 0–37)
Albumin: 4.1 g/dL (ref 3.5–5.2)
Alkaline Phosphatase: 53 U/L (ref 39–117)
Total Bilirubin: 0.4 mg/dL (ref 0.3–1.2)
Total Protein: 7.1 g/dL (ref 6.0–8.3)

## 2012-10-28 LAB — CBC WITH DIFFERENTIAL/PLATELET
Basophils Relative: 1.1 % (ref 0.0–3.0)
Eosinophils Relative: 2.2 % (ref 0.0–5.0)
HCT: 42.4 % (ref 36.0–46.0)
Hemoglobin: 14.5 g/dL (ref 12.0–15.0)
Lymphocytes Relative: 22.4 % (ref 12.0–46.0)
Lymphs Abs: 1.7 10*3/uL (ref 0.7–4.0)
MCV: 82.4 fl (ref 78.0–100.0)
Monocytes Relative: 3.5 % (ref 3.0–12.0)
Neutro Abs: 5.3 10*3/uL (ref 1.4–7.7)
Platelets: 246 10*3/uL (ref 150.0–400.0)
RDW: 12.7 % (ref 11.5–14.6)

## 2012-10-28 LAB — BASIC METABOLIC PANEL
CO2: 27 mEq/L (ref 19–32)
Calcium: 8.8 mg/dL (ref 8.4–10.5)
Creatinine, Ser: 0.9 mg/dL (ref 0.4–1.2)
GFR: 75.38 mL/min (ref >60.00)
Glucose, Bld: 83 mg/dL (ref 70–99)
Potassium: 3.7 mEq/L (ref 3.5–5.1)

## 2012-10-28 LAB — LIPID PANEL
Cholesterol: 183 mg/dL (ref 0–200)
HDL: 49.6 mg/dL (ref >39.00)
Total CHOL/HDL Ratio: 4
Triglycerides: 118 mg/dL (ref 0.0–149.0)

## 2012-10-29 ENCOUNTER — Encounter: Payer: Self-pay | Admitting: General Practice

## 2012-11-01 ENCOUNTER — Encounter: Payer: Self-pay | Admitting: Family Medicine

## 2012-11-01 NOTE — Assessment & Plan Note (Signed)
Chronic problem.  Not currently following w/ Neuro.  ADLs are independent.  Living w/ mom and dad.

## 2012-11-01 NOTE — Assessment & Plan Note (Signed)
Chronic problem.  Not currently on meds.  Attempting to control w/ healthy diet and regular exercise.  Check labs.  Adjust meds prn

## 2012-11-01 NOTE — Assessment & Plan Note (Signed)
Pt's PE WNL w/ exception of known spasticity from CP and mild MR.  Plans to schedule mammo.  Check labs.  Anticipatory guidance provided.

## 2013-03-15 ENCOUNTER — Encounter: Payer: Self-pay | Admitting: Family

## 2013-03-15 ENCOUNTER — Ambulatory Visit (INDEPENDENT_AMBULATORY_CARE_PROVIDER_SITE_OTHER): Payer: Medicare HMO | Admitting: Family

## 2013-03-15 VITALS — BP 130/80 | HR 68 | Temp 98.2°F | Resp 16 | Ht 66.0 in | Wt 206.0 lb

## 2013-03-15 DIAGNOSIS — R109 Unspecified abdominal pain: Secondary | ICD-10-CM

## 2013-03-15 DIAGNOSIS — L299 Pruritus, unspecified: Secondary | ICD-10-CM

## 2013-03-15 DIAGNOSIS — R6889 Other general symptoms and signs: Secondary | ICD-10-CM

## 2013-03-15 DIAGNOSIS — R196 Halitosis: Secondary | ICD-10-CM | POA: Insufficient documentation

## 2013-03-15 NOTE — Patient Instructions (Signed)
Please schedule your ultrasound on the first floor- we will contact you with your results.

## 2013-03-15 NOTE — Assessment & Plan Note (Signed)
Advised continued flossing, mouth wash and follow up with her dentist.

## 2013-03-15 NOTE — Progress Notes (Signed)
Pre visit review using our clinic review tool, if applicable. No additional management support is needed unless otherwise documented below in the visit note. 

## 2013-03-15 NOTE — Assessment & Plan Note (Signed)
Ear exam is normal today.  No otitis externa or drainage noted on exam. Advised trial of claritin once daily for itching.

## 2013-03-15 NOTE — Progress Notes (Signed)
Subjective:    Patient ID: Makenlee Mckeag, female    DOB: [birth year], 43 y.o.   MRN: [id number]  HPI  Ms. Deland is a 43yr old female with cerebral palsy who presents today with chief complaint of "pus" in both ears.  Mom noted a large "glob of pus." on the outside of the left ear. Had similar symptoms on the right ear. Notes occasional itching in her ears at night when she sleeps. Denies changes in her hearing.  Denies ear pain.   Halitosis- Mom reports that her "breath smells like poop."  She goes to the dentist every 6 months.  Pt reports that she flosses teeth and uses mouth wash.  Abdominal pain- Mom is concerned about gallbladder.  She notes abdominal pain about once a month.  Notes occasional nausea.  She saw Dr. Birdie Riddle with this complaint back in 2013 and the mother chose to bring the patient to [facility] evaluation rather than [facility] due to logistics. Mom tells me ED "never did an ultrasound." She would like to have [place] completed.    Review of Systems See HPI Past Medical History  Diagnosis Date  . Hyperlipidemia   . Hypertension   . Cardiac murmur   . Colon polyps   . Cerebral palsy   . Seasonal allergies   . Ear infection     recurrent    History   Social History  . Marital Status: Single    Spouse Name: N/A    Number of Children: N/A  . Years of Education: N/A   Occupational History  . Not on file.   Social History Main Topics  . Smoking status: Never Smoker   . Smokeless tobacco: Not on file  . Alcohol Use: Yes     Comment: occasionally  . Drug Use: No  . Sexual Activity: Not on file   Other Topics Concern  . Not on file   Social History Narrative  . No narrative on file    Past Surgical History  Procedure Laterality Date  . Hip surgery      bialteral  . Eye surgery    . Laparoscopic supracervical hysterectomy  01/31/09    Family History  Problem Relation Age of Onset  . Hypertension Mother   . Hyperlipidemia Mother   . Colon  polyps Mother     Allergies  Allergen Reactions  . Codeine     constipation    Current Outpatient Prescriptions on File Prior to Visit  Medication Sig Dispense Refill  . fluticasone (FLONASE) 50 MCG/ACT nasal spray Place 2 sprays into the nose daily.        . naproxen sodium (ANAPROX) 220 MG tablet Take 220 mg by mouth as needed.        . triamterene-hydrochlorothiazide (MAXZIDE-25) 37.5-25 MG per tablet Take 0.5 tablets by mouth daily.         No current facility-administered medications on file prior to visit.    BP 130/80  Pulse 68  Temp(Src) 98.2 F (36.8 C) (Oral)  Resp 16  Ht 5\' 6"  (1.676 m)  Wt 206 lb (93.441 kg)  BMI 33.27 kg/m2  SpO2 99%       Objective:   Physical Exam  Constitutional: She appears well-developed and well-nourished. No distress.  HENT:  Head: Normocephalic and atraumatic.  Right Ear: Tympanic membrane and ear canal normal. No drainage, swelling or tenderness.  Left Ear: Tympanic membrane and ear canal normal. No drainage, swelling or tenderness.  No  obvious dental hygiene issues.  Cardiovascular: Normal rate and regular rhythm.   No murmur heard. Neurological: She is alert.  Psychiatric: She has a normal mood and affect. Her behavior is normal. Thought content normal.          Assessment & Plan:

## 2013-03-15 NOTE — Assessment & Plan Note (Signed)
Refer for abdominal US to evaluate for cholelithiasis.

## 2013-04-04 ENCOUNTER — Ambulatory Visit (HOSPITAL_BASED_OUTPATIENT_CLINIC_OR_DEPARTMENT_OTHER): Payer: Medicare HMO

## 2013-04-05 ENCOUNTER — Ambulatory Visit (HOSPITAL_BASED_OUTPATIENT_CLINIC_OR_DEPARTMENT_OTHER)
Admission: RE | Admit: 2013-04-05 | Discharge: 2013-04-05 | Disposition: A | Discharge status: Home / Self Care | Payer: Medicare HMO | Source: Ambulatory Visit | Attending: Family | Admitting: Family

## 2013-04-05 DIAGNOSIS — R110 Nausea: Secondary | ICD-10-CM | POA: Insufficient documentation

## 2013-04-05 DIAGNOSIS — R109 Unspecified abdominal pain: Secondary | ICD-10-CM

## 2013-04-05 DIAGNOSIS — R1011 Right upper quadrant pain: Secondary | ICD-10-CM | POA: Insufficient documentation

## 2013-04-05 DIAGNOSIS — K8020 Calculus of gallbladder without cholecystitis without obstruction: Secondary | ICD-10-CM | POA: Insufficient documentation

## 2013-04-06 ENCOUNTER — Telehealth: Payer: Self-pay | Admitting: Family

## 2013-04-06 DIAGNOSIS — K8020 Calculus of gallbladder without cholecystitis without obstruction: Secondary | ICD-10-CM

## 2013-04-06 NOTE — Telephone Encounter (Signed)
US shows gallstones.  I recommend referral to surgery. Delsa Sale could you please notify pt about this when you call her re: referral.

## 2013-04-07 NOTE — Telephone Encounter (Signed)
Pt mother aware.

## 2013-04-18 ENCOUNTER — Encounter (INDEPENDENT_AMBULATORY_CARE_PROVIDER_SITE_OTHER): Payer: Self-pay

## 2013-04-18 ENCOUNTER — Ambulatory Visit (INDEPENDENT_AMBULATORY_CARE_PROVIDER_SITE_OTHER): Payer: Medicare HMO | Admitting: Surgery

## 2013-04-18 ENCOUNTER — Encounter (INDEPENDENT_AMBULATORY_CARE_PROVIDER_SITE_OTHER): Payer: Self-pay | Admitting: Surgery

## 2013-04-18 VITALS — BP 110/80 | HR 68 | Temp 98.2°F | Resp 14 | Ht 67.0 in | Wt 200.8 lb

## 2013-04-18 DIAGNOSIS — K8020 Calculus of gallbladder without cholecystitis without obstruction: Secondary | ICD-10-CM

## 2013-04-18 NOTE — Progress Notes (Signed)
Patient ID: Holly Saunders, female   DOB: 01/30/1971, 42 y.o.   MRN: [id number]  Chief Complaint  Patient presents with  . New Evaluation    eval cholelithiasis    HPI Holly Saunders is a 42 y.o. female.  Patient seen at request of Melissa O'Sullivan, NP For right upper quadrant abdominal pain. The patient has intermittent episodes of severe epigastric abdominal pain and right upper quadrant abdominal pain after eating. Fatty greasy and spicy foods make it worse. The pain lasts minutes to hours after eating, is sharp in nature and does not radiate. It is severe at times. She was seen at [facility] within the last month for this problem. There is associated nausea. She has chronic constipation. Ultrasound was done which shows gallstones and normal common bile duct. HPI  Past Medical History  Diagnosis Date  . Hyperlipidemia   . Hypertension   . Cardiac murmur   . Colon polyps   . Cerebral palsy   . Seasonal allergies   . Ear infection     recurrent    Past Surgical History  Procedure Laterality Date  . Hip surgery      bialteral  . Eye surgery    . Laparoscopic supracervical hysterectomy  01/31/09    Family History  Problem Relation Age of Onset  . Hypertension Mother   . Hyperlipidemia Mother   . Colon polyps Mother     Social History History  Substance Use Topics  . Smoking status: Never Smoker   . Smokeless tobacco: Never Used  . Alcohol Use: Yes     Comment: occasionally    Allergies  Allergen Reactions  . Codeine     constipation    Current Outpatient Prescriptions  Medication Sig Dispense Refill  . ibuprofen (ADVIL,MOTRIN) 200 MG tablet Take 200 mg by mouth every 6 (six) hours as needed.      . naproxen sodium (ANAPROX) 220 MG tablet Take 220 mg by mouth as needed.         No current facility-administered medications for this visit.    Review of Systems Review of Systems  Constitutional: Negative for fever, chills and unexpected weight change.   HENT: Negative for congestion, hearing loss, sore throat, trouble swallowing and voice change.   Eyes: Negative for visual disturbance.  Respiratory: Negative for cough and wheezing.   Cardiovascular: Negative for chest pain, palpitations and leg swelling.  Gastrointestinal: Positive for abdominal pain and constipation. Negative for nausea, vomiting, diarrhea, blood in stool, abdominal distention and anal bleeding.  Genitourinary: Negative for hematuria, vaginal bleeding and difficulty urinating.  Musculoskeletal: Negative for arthralgias.  Skin: Negative for rash and wound.  Neurological: Negative for seizures, syncope and headaches.  Hematological: Negative for adenopathy. Does not bruise/bleed easily.  Psychiatric/Behavioral: Negative for confusion.    Blood pressure 110/80, pulse 68, temperature 98.2 F (36.8 C), temperature source Oral, resp. rate 14, height 5' 7" (1.702 m), weight 200 lb 12.8 oz (91.082 kg).  Physical Exam Physical Exam  Constitutional: She is oriented to person, place, and time. She appears well-developed and well-nourished.  HENT:  Head: Normocephalic and atraumatic.  Eyes: Pupils are equal, round, and reactive to light. No scleral icterus. Left eye exhibits abnormal extraocular motion.  Cardiovascular: Normal rate and regular rhythm.   Pulmonary/Chest: Effort normal and breath sounds normal.  Abdominal: Soft. Bowel sounds are normal. She exhibits no distension and no mass. There is no tenderness. There is no rebound and no guarding.  Musculoskeletal: Normal range of   motion.  Neurological: She is alert and oriented to person, place, and time.  Pt has mild CP  Skin: Skin is warm and dry.  Psychiatric: She has a normal mood and affect. Her behavior is normal. Judgment and thought content normal.    Data Reviewed CLINICAL DATA: Right upper quadrant pain, nausea  EXAM:  ULTRASOUND ABDOMEN COMPLETE  COMPARISON: None.  FINDINGS:  Gallbladder:  The  gallbladder is filled with gallstones. There is no sonographic  Murphy sign. No pericholecystic fluid is identified.  Common bile duct:  Diameter: 3 mm  Liver:  No focal lesion identified. Within normal limits in parenchymal  echogenicity.  IVC:  Limited visualization.  Pancreas:  Visualized portion unremarkable.  Spleen:  Size and appearance within normal limits.  Right Kidney:  Length: 9.8 Cm. Echogenicity within normal limits. No mass or  hydronephrosis visualized.  Left Kidney:  Length: 9.1 Cm. Echogenicity within normal limits. No mass or  hydronephrosis visualized.  Abdominal aorta:  No aneurysm visualized.  Other findings:  None.  IMPRESSION:  Cholelithiasis without sonographic evidence of acute cholecystitis.    Assessment    Symptomatic cholelithiasis    Plan    Recommend laparoscopic cholecystectomy with cholangiogram.The procedure has been discussed with the patient. Operative and non operative treatments have been discussed. Risks of surgery include bleeding, infection,  Common bile duct injury,  Injury to the stomach,liver, colon,small intestine, abdominal wall,  Diaphragm,  Major blood vessels,  And the need for an open procedure.  Other risks include worsening of medical problems, death,  DVT and pulmonary embolism, and cardiovascular events.   Medical options have also been discussed. The patient has been informed of long term expectations of surgery and non surgical options,  The patient agrees to proceed.         Kameko Hukill A. 04/18/2013, 12:07 PM

## 2013-04-18 NOTE — Patient Instructions (Signed)
Laparoscopic Cholecystectomy, Care After These instructions give you information on caring for yourself after your procedure. Your doctor may also give you more specific instructions. Call your doctor if you have any problems or questions after your procedure.  HOME CARE Change your bandages (dressings) as told by your doctor. Keep the wound dry and clean. Wash the wound gently with soap and water. Pat the wound dry with a clean towel. Do not take baths, swim, or use hot tubs for 2 weeks, or as told by your doctor. Only take medicine as told by your doctor. Eat a normal diet as told by your doctor. Do not lift anything heavier than 10 pounds (4.5 kg) until your doctor says it is okay. Do not play contact sports for 1 week, or as told by your doctor. GET HELP IF: Your wound is red, puffy (swollen), or painful. You have yellowish-white fluid (pus) coming from the wound. You have fluid draining from the wound for more than 1 day. You have a bad smell coming from the wound. Your wound breaks open. GET HELP RIGHT AWAY IF:  You have a rash. You have trouble breathing. You have chest pain. You have a fever. You have pain in the shoulders (shoulder strap areas) that is getting worse. You feel dizzy or pass out (faint). You have severe belly (abdominal) pain. You feel sick to your stomach (nauseous) or throw up (vomit) for more than 1 day. Document Released: 11/27/2007 Document Revised: 12/08/2012 Document Reviewed: 09/29/2012 [facility] Patient Information 2014 [place]. Laparoscopic Cholecystectomy Laparoscopic cholecystectomy is surgery to remove the gallbladder. The gallbladder is located in the upper right part of the abdomen, behind the liver. It is a storage sac for bile produced in the liver. Bile aids in the digestion and absorption of fats. Cholecystectomy is often done for inflammation of the gallbladder (cholecystitis). This condition is usually caused by a buildup of gallstones  (cholelithiasis) in your gallbladder. Gallstones can block the flow of bile, resulting in inflammation and pain. In severe cases, emergency surgery may be required. When emergency surgery is not required, you will have time to prepare for the procedure. Laparoscopic surgery is an alternative to open surgery. Laparoscopic surgery has a shorter recovery time. Your common bile duct may also need to be examined during the procedure. If stones are found in the common bile duct, they may be removed. LET [facility] CARE PROVIDER KNOW ABOUT:  Any allergies you have.  All medicines you are taking, including vitamins, herbs, eye drops, creams, and over-the-counter medicines.  Previous problems you or members of your family have had with the use of anesthetics.  Any blood disorders you have.  Previous surgeries you have had.  Medical conditions you have. RISKS AND COMPLICATIONS Generally, this is a safe procedure. However, as with any procedure, complications can occur. Possible complications include:  Infection.  Damage to the common bile duct, nerves, arteries, veins, or other internal organs such as the stomach, liver, or intestines.  Bleeding.  A stone may remain in the common bile duct.  A bile leak from the cyst duct that is clipped when your gallbladder is removed.  The need to convert to open surgery, which requires a larger incision in the abdomen. This may be necessary if your surgeon thinks it is not safe to continue with a laparoscopic procedure. BEFORE THE PROCEDURE  Ask your health care provider about changing or stopping any regular medicines. You will need to stop taking aspirin or blood thinners at  least 5 days prior to surgery.  Do not eat or drink anything after midnight the night before surgery.  Let your health care provider know if you develop a cold or other infectious problem before surgery. PROCEDURE   You will be given medicine to make you sleep through the  procedure (general anesthetic). A breathing tube will be placed in your mouth.  When you are asleep, your surgeon will make several small cuts (incisions) in your abdomen.  A thin, lighted tube with a tiny camera on the end (laparoscope) is inserted through one of the small incisions. The camera on the laparoscope sends a picture to a TV screen in the operating room. This gives the surgeon a good view inside your abdomen.  A gas will be pumped into your abdomen. This expands your abdomen so that the surgeon has more room to perform the surgery.  Other tools needed for the procedure are inserted through the other incisions. The gallbladder is removed through one of the incisions.  After the removal of your gallbladder, the incisions will be closed with stitches, staples, or skin glue. AFTER THE PROCEDURE  You will be taken to a recovery area where your progress will be checked often.  You may be allowed to go home the same day if your pain is controlled and you can tolerate liquids. Document Released: 02/17/2005 Document Revised: 12/08/2012 Document Reviewed: 09/29/2012 [facility] Patient Information 2014 [place].

## 2013-04-25 ENCOUNTER — Encounter (HOSPITAL_COMMUNITY): Payer: Self-pay | Admitting: Pharmacy Technician

## 2013-04-27 ENCOUNTER — Encounter (HOSPITAL_COMMUNITY)
Admission: RE | Admit: 2013-04-27 | Discharge: 2013-04-27 | Disposition: A | Discharge status: Home / Self Care | Payer: Medicare HMO | Source: Ambulatory Visit | Attending: Surgery | Admitting: Surgery

## 2013-04-27 ENCOUNTER — Encounter (HOSPITAL_COMMUNITY): Payer: Self-pay

## 2013-04-27 ENCOUNTER — Encounter (HOSPITAL_COMMUNITY)
Admission: RE | Admit: 2013-04-27 | Discharge: 2013-04-27 | Disposition: A | Discharge status: Home / Self Care | Payer: Medicare HMO | Source: Ambulatory Visit | Attending: Anesthesiology | Admitting: Anesthesiology

## 2013-04-27 ENCOUNTER — Other Ambulatory Visit (HOSPITAL_COMMUNITY): Payer: Self-pay | Admitting: *Deleted

## 2013-04-27 DIAGNOSIS — Z01812 Encounter for preprocedural laboratory examination: Secondary | ICD-10-CM | POA: Insufficient documentation

## 2013-04-27 DIAGNOSIS — Z01810 Encounter for preprocedural cardiovascular examination: Secondary | ICD-10-CM | POA: Insufficient documentation

## 2013-04-27 DIAGNOSIS — Z01818 Encounter for other preprocedural examination: Secondary | ICD-10-CM | POA: Insufficient documentation

## 2013-04-27 HISTORY — DX: Headache: R51

## 2013-04-27 HISTORY — DX: Nausea with vomiting, unspecified: R11.2

## 2013-04-27 HISTORY — DX: Other specified postprocedural states: Z98.890

## 2013-04-27 LAB — COMPREHENSIVE METABOLIC PANEL
ALT: 12 U/L (ref 0–35)
AST: 13 U/L (ref 0–37)
Albumin: 3.8 g/dL (ref 3.5–5.2)
Alkaline Phosphatase: 51 U/L (ref 39–117)
BILIRUBIN TOTAL: 0.5 mg/dL (ref 0.3–1.2)
BUN: 15 mg/dL (ref 6–23)
CALCIUM: 9.3 mg/dL (ref 8.4–10.5)
CO2: 24 mEq/L (ref 19–32)
CREATININE: 0.88 mg/dL (ref 0.50–1.10)
Chloride: 103 mEq/L (ref 96–112)
GFR calc Af Amer: >90 mL/min (ref >90)
GFR calc non Af Amer: 79 mL/min — ABNORMAL LOW (ref >90)
Glucose, Bld: 96 mg/dL (ref 70–99)
Potassium: 4.4 mEq/L (ref 3.7–5.3)
Sodium: 139 mEq/L (ref 137–147)
TOTAL PROTEIN: 7.1 g/dL (ref 6.0–8.3)

## 2013-04-27 LAB — CBC WITH DIFFERENTIAL/PLATELET
BASOS ABS: 0.1 10*3/uL (ref 0.0–0.1)
Basophils Relative: 1 % (ref 0–1)
EOS ABS: 0.1 10*3/uL (ref 0.0–0.7)
EOS PCT: 2 % (ref 0–5)
HEMATOCRIT: 42.3 % (ref 36.0–46.0)
HEMOGLOBIN: 15.1 g/dL — AB (ref 12.0–15.0)
Lymphocytes Relative: 21 % (ref 12–46)
Lymphs Abs: 1.7 10*3/uL (ref 0.7–4.0)
MCH: 29 pg (ref 26.0–34.0)
MCHC: 35.7 g/dL (ref 30.0–36.0)
MCV: 81.3 fL (ref 78.0–100.0)
MONO ABS: 0.5 10*3/uL (ref 0.1–1.0)
MONOS PCT: 7 % (ref 3–12)
Neutro Abs: 5.6 10*3/uL (ref 1.7–7.7)
Neutrophils Relative %: 70 % (ref 43–77)
Platelets: 236 10*3/uL (ref 150–400)
RBC: 5.2 MIL/uL — ABNORMAL HIGH (ref 3.87–5.11)
RDW: 12.2 % (ref 11.5–15.5)
WBC: 8 10*3/uL (ref 4.0–10.5)

## 2013-04-27 MED ORDER — CHLORHEXIDINE GLUCONATE 4 % EX LIQD: 1.0000 "application " | Freq: Once | CUTANEOUS | Status: DC

## 2013-04-27 NOTE — Progress Notes (Signed)
Pt has cerebral palsy and falls easily - high fall risk.

## 2013-04-27 NOTE — Pre-Procedure Instructions (Signed)
Holly Saunders  04/27/2013   Your procedure is scheduled on:  Tuesday, May 03, 2013 at 7:30 AM.   Report to [facility] Entrance "A" Admitting Office at 5:30 AM.   Call this number if you have problems the morning of surgery: [phone]   Remember:   Do not eat food or drink liquids after midnight Monday, 05/02/13.   Take these medicines the morning of surgery with A SIP OF WATER: None   Do not wear jewelry, make-up or nail polish.  Do not wear lotions, powders, or perfumes. You may wear deodorant.  Do not shave 48 hours prior to surgery.   Do not bring valuables to the hospital.  [facility] is not responsible                  for any belongings or valuables.               Contacts, dentures or bridgework may not be worn into surgery.  Leave suitcase in the car. After surgery it may be brought to your room.  For patients admitted to the hospital, discharge time is determined by your                treatment team.               Patients discharged the day of surgery will not be allowed to drive home.  Name and phone number of your driver: Family/friend   Special Instructions: [facility] - Preparing for Surgery  Before surgery, you can play an important role.  Because skin is not sterile, your skin needs to be as free of germs as possible.  You can reduce the number of germs on you skin by washing with CHG (chlorahexidine gluconate) soap before surgery.  CHG is an antiseptic cleaner which kills germs and bonds with the skin to continue killing germs even after washing.  Please DO NOT use if you have an allergy to CHG or antibacterial soaps.  If your skin becomes reddened/irritated stop using the CHG and inform your nurse when you arrive at Short Stay.  Do not shave (including legs and underarms) for at least 48 hours prior to the first CHG shower.  You may shave your face.  Please follow these instructions carefully:   1.  Shower with CHG Soap the night before surgery  and the                                morning of Surgery.  2.  If you choose to wash your hair, wash your hair first as usual with your       normal shampoo.  3.  After you shampoo, rinse your hair and body thoroughly to remove the                      Shampoo.  4.  Use CHG as you would any other liquid soap.  You can apply chg directly       to the skin and wash gently with scrungie or a clean washcloth.  5.  Apply the CHG Soap to your body ONLY FROM THE NECK DOWN.        Do not use on open wounds or open sores.  Avoid contact with your eyes, ears, mouth and genitals (private parts).  Wash genitals (private parts) with your normal soap.  6.  Wash thoroughly,  paying special attention to the area where your surgery        will be performed.  7.  Thoroughly rinse your body with warm water from the neck down.  8.  DO NOT shower/wash with your normal soap after using and rinsing off       the CHG Soap.  9.  Pat yourself dry with a clean towel.            10.  Wear clean pajamas.            11.  Place clean sheets on your bed the night of your first shower and do not        sleep with pets.  Day of Surgery  Do not apply any lotions the morning of surgery.  Please wear clean clothes to the hospital/surgery center.     Please read over the following fact sheets that you were given: Pain Booklet, Coughing and Deep Breathing and Surgical Site Infection Prevention

## 2013-04-27 NOTE — Progress Notes (Signed)
Pt was able to do PAT appt by herself. She did ask that I go over day of surgery instructions with her mom, which I did.

## 2013-05-02 MED ORDER — CEFAZOLIN SODIUM-DEXTROSE 2-3 GM-% IV SOLR
2.0000 g | INTRAVENOUS | Status: AC
  Administered 2013-05-03: 2 g via INTRAVENOUS
  Filled 2013-05-02: qty 50

## 2013-05-03 ENCOUNTER — Encounter (HOSPITAL_COMMUNITY)
Admission: RE | Disposition: A | Discharge status: Home / Self Care | Payer: Self-pay | Source: Ambulatory Visit | Attending: Surgery

## 2013-05-03 ENCOUNTER — Encounter (HOSPITAL_COMMUNITY): Payer: Self-pay | Admitting: *Deleted

## 2013-05-03 ENCOUNTER — Encounter (HOSPITAL_COMMUNITY): Payer: Medicare HMO | Admitting: Anesthesiology

## 2013-05-03 ENCOUNTER — Ambulatory Visit (HOSPITAL_COMMUNITY): Payer: Medicare HMO

## 2013-05-03 ENCOUNTER — Ambulatory Visit (HOSPITAL_COMMUNITY): Payer: Medicare HMO | Admitting: Anesthesiology

## 2013-05-03 ENCOUNTER — Ambulatory Visit (HOSPITAL_COMMUNITY)
Admission: RE | Admit: 2013-05-03 | Discharge: 2013-05-03 | Disposition: A | Discharge status: Home / Self Care | Payer: Medicare HMO | Source: Ambulatory Visit | Attending: Surgery | Admitting: Surgery

## 2013-05-03 DIAGNOSIS — I10 Essential (primary) hypertension: Secondary | ICD-10-CM | POA: Insufficient documentation

## 2013-05-03 DIAGNOSIS — E785 Hyperlipidemia, unspecified: Secondary | ICD-10-CM | POA: Insufficient documentation

## 2013-05-03 DIAGNOSIS — Z860100 Personal history of colon polyps, unspecified: Secondary | ICD-10-CM | POA: Insufficient documentation

## 2013-05-03 DIAGNOSIS — R011 Cardiac murmur, unspecified: Secondary | ICD-10-CM | POA: Diagnosis not present

## 2013-05-03 DIAGNOSIS — Z885 Allergy status to narcotic agent status: Secondary | ICD-10-CM | POA: Insufficient documentation

## 2013-05-03 DIAGNOSIS — K8010 Calculus of gallbladder with chronic cholecystitis without obstruction: Secondary | ICD-10-CM | POA: Insufficient documentation

## 2013-05-03 DIAGNOSIS — F329 Major depressive disorder, single episode, unspecified: Secondary | ICD-10-CM | POA: Insufficient documentation

## 2013-05-03 DIAGNOSIS — R1011 Right upper quadrant pain: Secondary | ICD-10-CM

## 2013-05-03 DIAGNOSIS — F3289 Other specified depressive episodes: Secondary | ICD-10-CM | POA: Insufficient documentation

## 2013-05-03 HISTORY — PX: CHOLECYSTECTOMY: SHX55

## 2013-05-03 SURGERY — LAPAROSCOPIC CHOLECYSTECTOMY WITH INTRAOPERATIVE CHOLANGIOGRAM
Anesthesia: General | Site: Abdomen

## 2013-05-03 MED ORDER — EPHEDRINE SULFATE 50 MG/ML IJ SOLN
INTRAMUSCULAR | Status: AC
  Filled 2013-05-03: qty 1

## 2013-05-03 MED ORDER — HYDROMORPHONE HCL PF 1 MG/ML IJ SOLN
INTRAMUSCULAR | Status: AC
  Filled 2013-05-03: qty 1

## 2013-05-03 MED ORDER — SODIUM CHLORIDE 0.9 % IV SOLN
INTRAVENOUS | Status: DC | PRN
  Administered 2013-05-03: 07:00:00

## 2013-05-03 MED ORDER — FENTANYL CITRATE 0.05 MG/ML IJ SOLN
INTRAMUSCULAR | Status: AC
  Filled 2013-05-03: qty 5

## 2013-05-03 MED ORDER — METOCLOPRAMIDE HCL 5 MG/ML IJ SOLN
INTRAMUSCULAR | Status: AC
  Filled 2013-05-03: qty 2

## 2013-05-03 MED ORDER — BUPIVACAINE-EPINEPHRINE (PF) 0.25% -1:200000 IJ SOLN
INTRAMUSCULAR | Status: AC
  Filled 2013-05-03: qty 30

## 2013-05-03 MED ORDER — LIDOCAINE HCL (CARDIAC) 20 MG/ML IV SOLN
INTRAVENOUS | Status: AC
  Filled 2013-05-03: qty 5

## 2013-05-03 MED ORDER — ONDANSETRON HCL 4 MG/2ML IJ SOLN
INTRAMUSCULAR | Status: DC | PRN
  Administered 2013-05-03: 4 mg via INTRAVENOUS

## 2013-05-03 MED ORDER — DEXAMETHASONE SODIUM PHOSPHATE 4 MG/ML IJ SOLN
INTRAMUSCULAR | Status: DC | PRN
  Administered 2013-05-03: 4 mg via INTRAVENOUS

## 2013-05-03 MED ORDER — ROCURONIUM BROMIDE 100 MG/10ML IV SOLN
INTRAVENOUS | Status: DC | PRN
  Administered 2013-05-03: 5 mg via INTRAVENOUS
  Administered 2013-05-03: 35 mg via INTRAVENOUS

## 2013-05-03 MED ORDER — LIDOCAINE HCL (CARDIAC) 20 MG/ML IV SOLN
INTRAVENOUS | Status: DC | PRN
  Administered 2013-05-03: 100 mg via INTRAVENOUS

## 2013-05-03 MED ORDER — OXYCODONE HCL 5 MG/5ML PO SOLN: 5.0000 mg | Freq: Once | ORAL | Status: AC | PRN

## 2013-05-03 MED ORDER — HYDROMORPHONE HCL PF 1 MG/ML IJ SOLN
0.2500 mg | INTRAMUSCULAR | Status: DC | PRN
  Administered 2013-05-03: 0.5 mg via INTRAVENOUS

## 2013-05-03 MED ORDER — SUCCINYLCHOLINE CHLORIDE 20 MG/ML IJ SOLN
INTRAMUSCULAR | Status: AC
  Filled 2013-05-03: qty 1

## 2013-05-03 MED ORDER — ONDANSETRON HCL 4 MG/2ML IJ SOLN
INTRAMUSCULAR | Status: AC
  Filled 2013-05-03: qty 2

## 2013-05-03 MED ORDER — NEOSTIGMINE METHYLSULFATE 1 MG/ML IJ SOLN
INTRAMUSCULAR | Status: DC | PRN
  Administered 2013-05-03: 3 mg via INTRAVENOUS

## 2013-05-03 MED ORDER — MIDAZOLAM HCL 5 MG/5ML IJ SOLN
INTRAMUSCULAR | Status: DC | PRN
  Administered 2013-05-03: 1 mg via INTRAVENOUS

## 2013-05-03 MED ORDER — METOCLOPRAMIDE HCL 5 MG/ML IJ SOLN
INTRAMUSCULAR | Status: DC | PRN
Start: 2013-05-03 — End: 2013-05-03
  Administered 2013-05-03: 10 mg via INTRAVENOUS

## 2013-05-03 MED ORDER — OXYCODONE HCL 5 MG PO TABS
ORAL_TABLET | ORAL | Status: AC
  Filled 2013-05-03: qty 1

## 2013-05-03 MED ORDER — DEXAMETHASONE SODIUM PHOSPHATE 4 MG/ML IJ SOLN
INTRAMUSCULAR | Status: AC
  Filled 2013-05-03: qty 1

## 2013-05-03 MED ORDER — MIDAZOLAM HCL 2 MG/2ML IJ SOLN
INTRAMUSCULAR | Status: AC
  Filled 2013-05-03: qty 2

## 2013-05-03 MED ORDER — BUPIVACAINE-EPINEPHRINE 0.25% -1:200000 IJ SOLN
INTRAMUSCULAR | Status: DC | PRN
  Administered 2013-05-03: 7 mL

## 2013-05-03 MED ORDER — FENTANYL CITRATE 0.05 MG/ML IJ SOLN
INTRAMUSCULAR | Status: DC | PRN
  Administered 2013-05-03 (×2): 50 ug via INTRAVENOUS
  Administered 2013-05-03: 100 ug via INTRAVENOUS

## 2013-05-03 MED ORDER — OXYCODONE-ACETAMINOPHEN 5-325 MG PO TABS: 1.0000 | ORAL_TABLET | ORAL | Status: DC | PRN

## 2013-05-03 MED ORDER — GLYCOPYRROLATE 0.2 MG/ML IJ SOLN
INTRAMUSCULAR | Status: DC | PRN
  Administered 2013-05-03: 0.4 mg via INTRAVENOUS

## 2013-05-03 MED ORDER — LACTATED RINGERS IV SOLN
INTRAVENOUS | Status: DC | PRN
  Administered 2013-05-03: 07:00:00 via INTRAVENOUS

## 2013-05-03 MED ORDER — OXYCODONE HCL 5 MG PO TABS
5.0000 mg | ORAL_TABLET | Freq: Once | ORAL | Status: AC | PRN
  Administered 2013-05-03: 5 mg via ORAL

## 2013-05-03 MED ORDER — 0.9 % SODIUM CHLORIDE (POUR BTL) OPTIME
TOPICAL | Status: DC | PRN
  Administered 2013-05-03: 1000 mL

## 2013-05-03 MED ORDER — PROPOFOL 10 MG/ML IV BOLUS
INTRAVENOUS | Status: DC | PRN
  Administered 2013-05-03: 10 mg via INTRAVENOUS
  Administered 2013-05-03: 200 mg via INTRAVENOUS

## 2013-05-03 MED ORDER — SODIUM CHLORIDE 0.9 % IR SOLN
Status: DC | PRN
  Administered 2013-05-03: 1000 mL

## 2013-05-03 MED ORDER — ROCURONIUM BROMIDE 50 MG/5ML IV SOLN
INTRAVENOUS | Status: AC
  Filled 2013-05-03: qty 1

## 2013-05-03 MED ORDER — SODIUM CHLORIDE 0.9 % IJ SOLN
INTRAMUSCULAR | Status: AC
  Filled 2013-05-03: qty 10

## 2013-05-03 MED ORDER — ARTIFICIAL TEARS OP OINT
TOPICAL_OINTMENT | OPHTHALMIC | Status: AC
  Filled 2013-05-03: qty 3.5

## 2013-05-03 MED ORDER — PHENYLEPHRINE 40 MCG/ML (10ML) SYRINGE FOR IV PUSH (FOR BLOOD PRESSURE SUPPORT)
PREFILLED_SYRINGE | INTRAVENOUS | Status: AC
  Filled 2013-05-03: qty 10

## 2013-05-03 MED ORDER — METOCLOPRAMIDE HCL 5 MG/ML IJ SOLN
10.0000 mg | Freq: Once | INTRAMUSCULAR | Status: AC | PRN
  Administered 2013-05-03: 10 mg via INTRAVENOUS

## 2013-05-03 SURGICAL SUPPLY — 48 items
ADH SKN CLS APL DERMABOND .7 (GAUZE/BANDAGES/DRESSINGS) ×1
APPLIER CLIP ROT 10 11.4 M/L (STAPLE) ×3
APR CLP MED LRG 11.4X10 (STAPLE) ×1
BAG SPEC RTRVL LRG 6X4 10 (ENDOMECHANICALS) ×1
BLADE SURG ROTATE 9660 (MISCELLANEOUS) IMPLANT
CANISTER SUCTION 2500CC (MISCELLANEOUS) ×3 IMPLANT
CHLORAPREP W/TINT 26ML (MISCELLANEOUS) ×3 IMPLANT
CLIP APPLIE ROT 10 11.4 M/L (STAPLE) ×1 IMPLANT
COVER MAYO STAND STRL (DRAPES) ×3 IMPLANT
COVER SURGICAL LIGHT HANDLE (MISCELLANEOUS) ×3 IMPLANT
DECANTER SPIKE VIAL GLASS SM (MISCELLANEOUS) ×2 IMPLANT
DERMABOND ADVANCED (GAUZE/BANDAGES/DRESSINGS) ×2
DERMABOND ADVANCED .7 DNX12 (GAUZE/BANDAGES/DRESSINGS) ×1 IMPLANT
DRAPE C-ARM 42X72 X-RAY (DRAPES) ×3 IMPLANT
DRAPE UTILITY 15X26 W/TAPE STR (DRAPE) ×6 IMPLANT
DRAPE WARM FLUID 44X44 (DRAPE) ×3 IMPLANT
ELECT REM PT RETURN 9FT ADLT (ELECTROSURGICAL) ×3
ELECTRODE REM PT RTRN 9FT ADLT (ELECTROSURGICAL) ×1 IMPLANT
GLOVE BIO SURGEON STRL SZ7.5 (GLOVE) ×2 IMPLANT
GLOVE BIO SURGEON STRL SZ8 (GLOVE) ×3 IMPLANT
GLOVE BIOGEL PI IND STRL 7.0 (GLOVE) IMPLANT
GLOVE BIOGEL PI IND STRL 7.5 (GLOVE) IMPLANT
GLOVE BIOGEL PI IND STRL 8 (GLOVE) ×1 IMPLANT
GLOVE BIOGEL PI INDICATOR 7.0 (GLOVE) ×6
GLOVE BIOGEL PI INDICATOR 7.5 (GLOVE) ×2
GLOVE BIOGEL PI INDICATOR 8 (GLOVE) ×2
GLOVE ECLIPSE 7.5 STRL STRAW (GLOVE) ×2 IMPLANT
GLOVE SURG SS PI 7.0 STRL IVOR (GLOVE) ×4 IMPLANT
GLOVE SURG SS PI 7.5 STRL IVOR (GLOVE) ×2 IMPLANT
GOWN STRL NON-REIN LRG LVL3 (GOWN DISPOSABLE) ×10 IMPLANT
GOWN STRL REIN XL XLG (GOWN DISPOSABLE) ×3 IMPLANT
KIT BASIN OR (CUSTOM PROCEDURE TRAY) ×3 IMPLANT
KIT ROOM TURNOVER OR (KITS) ×3 IMPLANT
NS IRRIG 1000ML POUR BTL (IV SOLUTION) ×4 IMPLANT
PAD ARMBOARD 7.5X6 YLW CONV (MISCELLANEOUS) ×3 IMPLANT
POUCH SPECIMEN RETRIEVAL 10MM (ENDOMECHANICALS) ×3 IMPLANT
SCISSORS LAP 5X35 DISP (ENDOMECHANICALS) ×3 IMPLANT
SET CHOLANGIOGRAPH 5 50 .035 (SET/KITS/TRAYS/PACK) ×3 IMPLANT
SET IRRIG TUBING LAPAROSCOPIC (IRRIGATION / IRRIGATOR) ×3 IMPLANT
SLEEVE ENDOPATH XCEL 5M (ENDOMECHANICALS) ×3 IMPLANT
SPECIMEN JAR SMALL (MISCELLANEOUS) ×3 IMPLANT
SUT MNCRL AB 4-0 PS2 18 (SUTURE) ×3 IMPLANT
TOWEL OR 17X24 6PK STRL BLUE (TOWEL DISPOSABLE) ×3 IMPLANT
TOWEL OR 17X26 10 PK STRL BLUE (TOWEL DISPOSABLE) ×3 IMPLANT
TRAY LAPAROSCOPIC (CUSTOM PROCEDURE TRAY) ×3 IMPLANT
TROCAR XCEL BLUNT TIP 100MML (ENDOMECHANICALS) ×3 IMPLANT
TROCAR XCEL NON-BLD 11X100MML (ENDOMECHANICALS) ×3 IMPLANT
TROCAR XCEL NON-BLD 5MMX100MML (ENDOMECHANICALS) ×3 IMPLANT

## 2013-05-03 NOTE — Anesthesia Procedure Notes (Signed)
Procedure Name: Intubation Date/Time: 05/03/2013 7:32 AM Performed by: Carola Frost Pre-anesthesia Checklist: Patient identified, Timeout performed, Emergency Drugs available, Suction available and Patient being monitored Patient Re-evaluated:Patient Re-evaluated prior to inductionOxygen Delivery Method: Circle system utilized Intubation Type: IV induction Ventilation: Mask ventilation without difficulty Laryngoscope Size: 3 and Mac Grade View: Grade I Tube type: Oral Tube size: 7.0 mm Number of attempts: 1 Airway Equipment and Method: Stylet Placement Confirmation: positive ETCO2,  ETT inserted through vocal cords under direct vision and breath sounds checked- equal and bilateral Secured at: 22 cm Tube secured with: Tape Dental Injury: Teeth and Oropharynx as per pre-operative assessment

## 2013-05-03 NOTE — Anesthesia Preprocedure Evaluation (Addendum)
Anesthesia Evaluation  Patient identified by MRN, date of birth, ID band Patient awake    Reviewed: Allergy & Precautions, H&P , NPO status , Patient's Chart, lab work & pertinent test results, reviewed documented beta blocker date and time   History of Anesthesia Complications (+) PONV and history of anesthetic complications  Airway Mallampati: II TM Distance: >3 FB Neck ROM: full    Dental  (+) Dental Advisory Given   Pulmonary neg pulmonary ROS,  breath sounds clear to auscultation        Cardiovascular hypertension, + Valvular Problems/Murmurs Rhythm:regular     Neuro/Psych  Headaches, PSYCHIATRIC DISORDERS Depression CVA, Residual Symptoms    GI/Hepatic negative GI ROS, Neg liver ROS,   Endo/Other  negative endocrine ROS  Renal/GU negative Renal ROS  negative genitourinary   Musculoskeletal   Abdominal   Peds  Hematology negative hematology ROS (+)   Anesthesia Other Findings See surgeon's H&P   Reproductive/Obstetrics negative OB ROS                           Anesthesia Physical Anesthesia Plan  ASA: III  Anesthesia Plan: General   Post-op Pain Management:    Induction: Intravenous  Airway Management Planned: Oral ETT  Additional Equipment:   Intra-op Plan:   Post-operative Plan: Extubation in OR  Informed Consent: I have reviewed the patients History and Physical, chart, labs and discussed the procedure including the risks, benefits and alternatives for the proposed anesthesia with the patient or authorized representative who has indicated his/her understanding and acceptance.   Dental Advisory Given  Plan Discussed with: CRNA and Surgeon  Anesthesia Plan Comments:         Anesthesia Quick Evaluation

## 2013-05-03 NOTE — Preoperative (Signed)
Beta Blockers   Reason not to administer Beta Blockers:Not Applicable 

## 2013-05-03 NOTE — H&P (View-Only) (Signed)
Patient ID: Holly Saunders, female   DOB: Nov 15, 1970, 43 y.o.   MRN: [id number]  Chief Complaint  Patient presents with  . New Evaluation    eval cholelithiasis    HPI Holly Saunders is a 43 y.o. female.  Patient seen at request of Debbrah Alar, NP For right upper quadrant abdominal pain. The patient has intermittent episodes of severe epigastric abdominal pain and right upper quadrant abdominal pain after eating. Fatty greasy and spicy foods make it worse. The pain lasts minutes to hours after eating, is sharp in nature and does not radiate. It is severe at times. She was seen at [facility] within the last month for this problem. There is associated nausea. She has chronic constipation. Ultrasound was done which shows gallstones and normal common bile duct. HPI  Past Medical History  Diagnosis Date  . Hyperlipidemia   . Hypertension   . Cardiac murmur   . Colon polyps   . Cerebral palsy   . Seasonal allergies   . Ear infection     recurrent    Past Surgical History  Procedure Laterality Date  . Hip surgery      bialteral  . Eye surgery    . Laparoscopic supracervical hysterectomy  01/31/09    Family History  Problem Relation Age of Onset  . Hypertension Mother   . Hyperlipidemia Mother   . Colon polyps Mother     Social History History  Substance Use Topics  . Smoking status: Never Smoker   . Smokeless tobacco: Never Used  . Alcohol Use: Yes     Comment: occasionally    Allergies  Allergen Reactions  . Codeine     constipation    Current Outpatient Prescriptions  Medication Sig Dispense Refill  . ibuprofen (ADVIL,MOTRIN) 200 MG tablet Take 200 mg by mouth every 6 (six) hours as needed.      . naproxen sodium (ANAPROX) 220 MG tablet Take 220 mg by mouth as needed.         No current facility-administered medications for this visit.    Review of Systems Review of Systems  Constitutional: Negative for fever, chills and unexpected weight change.   HENT: Negative for congestion, hearing loss, sore throat, trouble swallowing and voice change.   Eyes: Negative for visual disturbance.  Respiratory: Negative for cough and wheezing.   Cardiovascular: Negative for chest pain, palpitations and leg swelling.  Gastrointestinal: Positive for abdominal pain and constipation. Negative for nausea, vomiting, diarrhea, blood in stool, abdominal distention and anal bleeding.  Genitourinary: Negative for hematuria, vaginal bleeding and difficulty urinating.  Musculoskeletal: Negative for arthralgias.  Skin: Negative for rash and wound.  Neurological: Negative for seizures, syncope and headaches.  Hematological: Negative for adenopathy. Does not bruise/bleed easily.  Psychiatric/Behavioral: Negative for confusion.    Blood pressure 110/80, pulse 68, temperature 98.2 F (36.8 C), temperature source Oral, resp. rate 14, height 5\' 7"  (1.702 m), weight 200 lb 12.8 oz (91.082 kg).  Physical Exam Physical Exam  Constitutional: She is oriented to person, place, and time. She appears well-developed and well-nourished.  HENT:  Head: Normocephalic and atraumatic.  Eyes: Pupils are equal, round, and reactive to light. No scleral icterus. Left eye exhibits abnormal extraocular motion.  Cardiovascular: Normal rate and regular rhythm.   Pulmonary/Chest: Effort normal and breath sounds normal.  Abdominal: Soft. Bowel sounds are normal. She exhibits no distension and no mass. There is no tenderness. There is no rebound and no guarding.  Musculoskeletal: Normal range of  motion.  Neurological: She is alert and oriented to person, place, and time.  Pt has mild CP  Skin: Skin is warm and dry.  Psychiatric: She has a normal mood and affect. Her behavior is normal. Judgment and thought content normal.    Data Reviewed CLINICAL DATA: Right upper quadrant pain, nausea  EXAM:  ULTRASOUND ABDOMEN COMPLETE  COMPARISON: None.  FINDINGS:  Gallbladder:  The  gallbladder is filled with gallstones. There is no sonographic  Murphy sign. No pericholecystic fluid is identified.  Common bile duct:  Diameter: 3 mm  Liver:  No focal lesion identified. Within normal limits in parenchymal  echogenicity.  IVC:  Limited visualization.  Pancreas:  Visualized portion unremarkable.  Spleen:  Size and appearance within normal limits.  Right Kidney:  Length: 9.8 Cm. Echogenicity within normal limits. No mass or  hydronephrosis visualized.  Left Kidney:  Length: 9.1 Cm. Echogenicity within normal limits. No mass or  hydronephrosis visualized.  Abdominal aorta:  No aneurysm visualized.  Other findings:  None.  IMPRESSION:  Cholelithiasis without sonographic evidence of acute cholecystitis.    Assessment    Symptomatic cholelithiasis    Plan    Recommend laparoscopic cholecystectomy with cholangiogram.The procedure has been discussed with the patient. Operative and non operative treatments have been discussed. Risks of surgery include bleeding, infection,  Common bile duct injury,  Injury to the stomach,liver, colon,small intestine, abdominal wall,  Diaphragm,  Major blood vessels,  And the need for an open procedure.  Other risks include worsening of medical problems, death,  DVT and pulmonary embolism, and cardiovascular events.   Medical options have also been discussed. The patient has been informed of long term expectations of surgery and non surgical options,  The patient agrees to proceed.         Daivik Overley A. 04/18/2013, 12:07 PM

## 2013-05-03 NOTE — Discharge Instructions (Signed)
[facility] ______[facility] °LAPAROSCOPIC SURGERY: POST OP INSTRUCTIONS °Always review your discharge instruction sheet given to you by the facility where your surgery was performed. °IF YOU HAVE DISABILITY OR FAMILY LEAVE FORMS, YOU MUST BRING THEM TO THE OFFICE FOR PROCESSING.   °DO NOT GIVE THEM TO YOUR DOCTOR. ° °1. A prescription for pain medication may be given to you upon discharge.  Take your pain medication as prescribed, if needed.  If narcotic pain medicine is not needed, then you may take acetaminophen (Tylenol) or ibuprofen (Advil) as needed. °2. Take your usually prescribed medications unless otherwise directed. °3. If you need a refill on your pain medication, please contact your pharmacy.  They will contact our office to request authorization. Prescriptions will not be filled after 5pm or on week-ends. °4. You should follow a light diet the first few days after arrival home, such as soup and crackers, etc.  Be sure to include lots of fluids daily. °5. Most patients will experience some swelling and bruising in the area of the incisions.  Ice packs will help.  Swelling and bruising can take several days to resolve.  °6. It is common to experience some constipation if taking pain medication after surgery.  Increasing fluid intake and taking a stool softener (such as Colace) will usually help or prevent this problem from occurring.  A mild laxative (Milk of Magnesia or Miralax) should be taken according to package instructions if there are no bowel movements after 48 hours. °7. Unless discharge instructions indicate otherwise, you may remove your bandages 24-48 hours after surgery, and you may shower at that time.  You may have steri-strips (small skin tapes) in place directly over the incision.  These strips should be left on the skin for 7-10 days.  If your surgeon used skin glue on the incision, you may shower in 24 hours.  The glue will flake off over the next 2-3 weeks.  Any sutures or  staples will be removed at the office during your follow-up visit. °8. ACTIVITIES:  You may resume regular (light) daily activities beginning the next day--such as daily self-care, walking, climbing stairs--gradually increasing activities as tolerated.  You may have sexual intercourse when it is comfortable.  Refrain from any heavy lifting or straining until approved by your doctor. °a. You may drive when you are no longer taking prescription pain medication, you can comfortably wear a seatbelt, and you can safely maneuver your car and apply brakes. °b. RETURN TO WORK:  __________________________________________________________ °9. You should see your doctor in the office for a follow-up appointment approximately 2-3 weeks after your surgery.  Make sure that you call for this appointment within a day or two after you arrive home to insure a convenient appointment time. °10. OTHER INSTRUCTIONS: __________________________________________________________________________________________________________________________ __________________________________________________________________________________________________________________________ °WHEN TO CALL YOUR DOCTOR: °1. Fever over 101.0 °2. Inability to urinate °3. Continued bleeding from incision. °4. Increased pain, redness, or drainage from the incision. °5. Increasing abdominal pain ° °The clinic staff is available to answer your questions during regular business hours.  Please don’t hesitate to call and ask to speak to one of the nurses for clinical concerns.  If you have a medical emergency, go to the nearest emergency room or call 911.  A surgeon from [facility] is always on call at the hospital. °[address], [address], [place], [place]  [address] ? [address] [address], [place],    [address] °[phone] ? [phone] ? FAX [phone] °Web site:   [email or web address]  What to eat:  For your first meals, you should eat  lightly; only small meals initially.  If you do not have nausea, you may eat larger meals.  Avoid spicy, greasy and heavy food.    General Anesthesia, Adult, Care After  Refer to this sheet in the next few weeks. These instructions provide you with information on caring for yourself after your procedure. Your health care provider may also give you more specific instructions. Your treatment has been planned according to current medical practices, but problems sometimes occur. Call your health care provider if you have any problems or questions after your procedure.  WHAT TO EXPECT AFTER THE PROCEDURE  After the procedure, it is typical to experience:  Sleepiness.  Nausea and vomiting. HOME CARE INSTRUCTIONS  For the first 24 hours after general anesthesia:  Have a responsible person with you.  Do not drive a car. If you are alone, do not take public transportation.  Do not drink alcohol.  Do not take medicine that has not been prescribed by your health care provider.  Do not sign important papers or make important decisions.  You may resume a normal diet and activities as directed by your health care provider.  Change bandages (dressings) as directed.  If you have questions or problems that seem related to general anesthesia, call the hospital and ask for the anesthetist or anesthesiologist on call. SEEK MEDICAL CARE IF:  You have nausea and vomiting that continue the day after anesthesia.  You develop a rash. SEEK IMMEDIATE MEDICAL CARE IF:  You have difficulty breathing.  You have chest pain.  You have any allergic problems. Document Released: 05/26/2000 Document Revised: 10/20/2012 Document Reviewed: 09/02/2012  [facility] Patient Information 2014 [place], [place].   What to eat:  For your first meals, you should eat lightly; only small meals initially.  If you do not have nausea, you may eat larger meals.  Avoid spicy, greasy and heavy food.    General Anesthesia, Adult, Care After   Refer to this sheet in the next few weeks. These instructions provide you with information on caring for yourself after your procedure. Your health care provider may also give you more specific instructions. Your treatment has been planned according to current medical practices, but problems sometimes occur. Call your health care provider if you have any problems or questions after your procedure.  WHAT TO EXPECT AFTER THE PROCEDURE  After the procedure, it is typical to experience:  Sleepiness.  Nausea and vomiting. HOME CARE INSTRUCTIONS  For the first 24 hours after general anesthesia:  Have a responsible person with you.  Do not drive a car. If you are alone, do not take public transportation.  Do not drink alcohol.  Do not take medicine that has not been prescribed by your health care provider.  Do not sign important papers or make important decisions.  You may resume a normal diet and activities as directed by your health care provider.  Change bandages (dressings) as directed.  If you have questions or problems that seem related to general anesthesia, call the hospital and ask for the anesthetist or anesthesiologist on call. SEEK MEDICAL CARE IF:  You have nausea and vomiting that continue the day after anesthesia.  You develop a rash. SEEK IMMEDIATE MEDICAL CARE IF:  You have difficulty breathing.  You have chest pain.  You have any allergic problems. Document Released: 05/26/2000 Document Revised: 10/20/2012 Document Reviewed: 09/02/2012  [facility] Patient Information 2014 [place], [place].

## 2013-05-03 NOTE — Progress Notes (Signed)
Patient complained of nausea with dry heaves.  Noted order for reglan 20mg , which was administered.  Patient seemed to tolerate medication well.  Later, noted red, non raised rash over patients torso and spreading down onto the hips and thighs.  Noted that the rash was only where the gown touched the patient.  Dr. Girard Cooter notified immediately.  No new orders.  Rash completely gone within 5 minutes, but reoccurred briefly one additional time.  No change in vital signs, or airway.

## 2013-05-03 NOTE — Transfer of Care (Signed)
Immediate Anesthesia Transfer of Care Note  Patient: Holly Saunders  Procedure(s) Performed: Procedure(s): LAPAROSCOPIC CHOLECYSTECTOMY WITH INTRAOPERATIVE CHOLANGIOGRAM (N/A)  Patient Location: PACU  Anesthesia Type:General  Level of Consciousness: awake, alert  and oriented  Airway & Oxygen Therapy: Patient Spontanous Breathing and Patient connected to nasal cannula oxygen  Post-op Assessment: Report given to PACU RN, Post -op Vital signs reviewed and stable and Patient moving all extremities X 4  Post vital signs: Reviewed and stable  Complications: No apparent anesthesia complications

## 2013-05-03 NOTE — Anesthesia Postprocedure Evaluation (Signed)
Anesthesia Post Note  Patient: Holly Saunders  Procedure(s) Performed: Procedure(s) (LRB): LAPAROSCOPIC CHOLECYSTECTOMY WITH INTRAOPERATIVE CHOLANGIOGRAM (N/A)  Anesthesia type: General  Patient location: PACU  Post pain: Pain level controlled  Post assessment: Patient's Cardiovascular Status Stable  Last Vitals:  Filed Vitals:   05/03/13 0930  BP: 136/79  Pulse: 66  Temp:   Resp: 15    Post vital signs: Reviewed and stable  Level of consciousness: alert  Complications: No apparent anesthesia complications

## 2013-05-03 NOTE — Op Note (Signed)
Laparoscopic Cholecystectomy with IOC Procedure Note  Indications: This patient presents with symptomatic gallbladder disease and will undergo laparoscopic cholecystectomy. The procedure has been discussed with the patient. Operative and non operative treatments have been discussed. Risks of surgery include bleeding, infection,  Common bile duct injury,  Injury to the stomach,liver, colon,small intestine, abdominal wall,  Diaphragm,  Major blood vessels,  And the need for an open procedure.  Other risks include worsening of medical problems, death,  DVT and pulmonary embolism, and cardiovascular events.   Medical options have also been discussed. The patient has been informed of long term expectations of surgery and non surgical options,  The patient agrees to proceed.    Pre-operative Diagnosis: Calculus of gallbladder without mention of cholecystitis or obstruction  Post-operative Diagnosis: Same  Surgeon: Midge Momon A.   Assistants: OR staff  Anesthesia: General endotracheal anesthesia and Local anesthesia 0.25.% bupivacaine, with epinephrine  ASA Class: 2  Procedure Details  The patient was seen again in the Holding Room. The risks, benefits, complications, treatment options, and expected outcomes were discussed with the patient. The possibilities of reaction to medication, pulmonary aspiration, perforation of viscus, bleeding, recurrent infection, finding a normal gallbladder, the need for additional procedures, failure to diagnose a condition, the possible need to convert to an open procedure, and creating a complication requiring transfusion or operation were discussed with the patient. The patient and/or family concurred with the proposed plan, giving informed consent. The site of surgery properly noted/marked. The patient was taken to Operating Room, identified as Holly Saunders and the procedure verified as Laparoscopic Cholecystectomy with Intraoperative Cholangiograms. A Time Out  was held and the above information confirmed.  Prior to the induction of general anesthesia, antibiotic prophylaxis was administered. General endotracheal anesthesia was then administered and tolerated well. After the induction, the abdomen was prepped in the usual sterile fashion. The patient was positioned in the supine position with the left arm comfortably tucked, along with some reverse Trendelenburg.  Local anesthetic agent was injected into the skin near the umbilicus and an incision made. The midline fascia was incised and the Hasson technique was used to introduce a 12 mm port under direct vision. It was secured with a figure of eight Vicryl suture placed in the usual fashion. Pneumoperitoneum was then created with CO2 and tolerated well without any adverse changes in the patient's vital signs. Additional trocars were introduced under direct vision with an 11 mm trocar in the epigastrium and  Two  5 mm trocars in the right upper quadrant. All skin incisions were infiltrated with a local anesthetic agent before making the incision and placing the trocars.   The gallbladder was identified, the fundus grasped and retracted cephalad. Adhesions were lysed bluntly and with the electrocautery where indicated, taking care not to injure any adjacent organs or viscus. The infundibulum was grasped and retracted laterally, exposing the peritoneum overlying the triangle of Calot. This was then divided and exposed in a blunt fashion. The cystic duct was clearly identified and bluntly dissected circumferentially. The junctions of the gallbladder, cystic duct and common bile duct were clearly identified prior to the division of any linear structure.   An incision was made in the cystic duct and the cholangiogram catheter introduced. The catheter was secured using an endoclip. The study showed no stones and good visualization of the distal and proximal biliary tree. The catheter was then removed.   The cystic duct  was then  ligated with surgical clips  on the  patient side and  clipped on the gallbladder side and divided. The cystic artery was identified, dissected free, ligated with clips and divided as well. Posterior cystic artery clipped and divided.  The gallbladder was dissected from the liver bed in retrograde fashion with the electrocautery. The gallbladder was removed. The liver bed was irrigated and inspected. Hemostasis was achieved with the electrocautery. Copious irrigation was utilized and was repeatedly aspirated until clear all particulate matter. Hemostasis was achieved with no signs  Of bleeding or bile leakage.  Pneumoperitoneum was completely reduced after viewing removal of the trocars under direct vision. The wound was thoroughly irrigated and the fascia was then closed with a figure of eight suture; the skin was then closed with 4 O monocryl  and a sterile dressing  Of Dermabond was applied.  Instrument, sponge, and needle counts were correct at closure and at the conclusion of the case.   Findings:  Cholelithiasis  Estimated Blood Loss: Minimal                  Total IV Fluids: 400 mL         Specimens: Gallbladder           Complications: None; patient tolerated the procedure well.         Disposition: PACU - hemodynamically stable.         Condition: stable

## 2013-05-03 NOTE — Interval H&P Note (Signed)
History and Physical Interval Note:  05/03/2013 6:58 AM  Holly Saunders  has presented today for surgery, with the diagnosis of gallstones  The various methods of treatment have been discussed with the patient and family. After consideration of risks, benefits and other options for treatment, the patient has consented to  Procedure(s): LAPAROSCOPIC CHOLECYSTECTOMY WITH INTRAOPERATIVE CHOLANGIOGRAM (N/A) as a surgical intervention .  The patient's history has been reviewed, patient examined, no change in status, stable for surgery.  I have reviewed the patient's chart and labs.  Questions were answered to the patient's satisfaction.     Holly Saunders A.

## 2013-05-05 ENCOUNTER — Encounter (HOSPITAL_COMMUNITY): Payer: Self-pay | Admitting: Surgery

## 2013-05-23 ENCOUNTER — Ambulatory Visit (INDEPENDENT_AMBULATORY_CARE_PROVIDER_SITE_OTHER): Payer: Medicare HMO | Admitting: Surgery

## 2013-05-23 ENCOUNTER — Encounter (INDEPENDENT_AMBULATORY_CARE_PROVIDER_SITE_OTHER): Payer: Self-pay | Admitting: Surgery

## 2013-05-23 VITALS — BP 132/78 | HR 76 | Temp 98.3°F | Resp 16 | Ht 67.0 in | Wt 198.0 lb

## 2013-05-23 DIAGNOSIS — Z9889 Other specified postprocedural states: Secondary | ICD-10-CM

## 2013-05-23 NOTE — Patient Instructions (Signed)
Return as needed. Resume activity.

## 2013-05-23 NOTE — Progress Notes (Signed)
she is here for a postop visit following laparoscopic cholecystectomy.  Diet is being tolerated, bowels are moving.  No problems with incisions.  PE:  ABD:  Soft, incisions clean/dry/intact and solid. Gallbladder - CHRONIC CHOLECYSTITIS WITH CHOLELITHIASIS Assessment:  Doing well postop.  Plan:  Lowfat diet recommended.  Activities as tolerated.  Return visit prn.

## 2013-09-01 ENCOUNTER — Telehealth: Payer: Self-pay | Admitting: Family Medicine

## 2013-09-01 NOTE — Telephone Encounter (Signed)
Caller name: Edd Fabian Relation to pt: mom Call back [place]:  Maura Crandall  Reason for call:   Pt's mom states that she is having migraines again, and is wanting to get her previous medication that she was taking for migraines.  Pt does not want to have to go to ED for her headache.  Please advise.

## 2013-09-01 NOTE — Telephone Encounter (Signed)
Advised mom that we have not sent any medication in for the patient recently. The Roxicet in her chart was not prescribed by this office. Mom questioned if we could send something in. Advised that patient would need an evaluation to get a prescription. Mom questioned if she could give her daughter her hydrocodone. Advised that I could not recommend and that we never encourage someone taking medication that is not prescribed for them. States understanding. Advised that if headache worsens or does not resolve with OTC medication to to urgent care or ED. Mom states that it is not that bad yet but states understanding of recommendations.

## 2013-12-19 ENCOUNTER — Ambulatory Visit (INDEPENDENT_AMBULATORY_CARE_PROVIDER_SITE_OTHER): Payer: Medicare HMO | Admitting: Family Medicine

## 2013-12-19 ENCOUNTER — Other Ambulatory Visit: Payer: Self-pay | Admitting: Family Medicine

## 2013-12-19 ENCOUNTER — Encounter: Payer: Self-pay | Admitting: General Practice

## 2013-12-19 ENCOUNTER — Encounter: Payer: Self-pay | Admitting: Family Medicine

## 2013-12-19 VITALS — BP 130/80 | HR 76 | Temp 98.3°F | Resp 16 | Ht 66.0 in | Wt 187.1 lb

## 2013-12-19 DIAGNOSIS — F32A Depression, unspecified: Secondary | ICD-10-CM

## 2013-12-19 DIAGNOSIS — G809 Cerebral palsy, unspecified: Secondary | ICD-10-CM

## 2013-12-19 DIAGNOSIS — F329 Major depressive disorder, single episode, unspecified: Secondary | ICD-10-CM

## 2013-12-19 DIAGNOSIS — D582 Other hemoglobinopathies: Secondary | ICD-10-CM

## 2013-12-19 DIAGNOSIS — I10 Essential (primary) hypertension: Secondary | ICD-10-CM

## 2013-12-19 DIAGNOSIS — K5901 Slow transit constipation: Secondary | ICD-10-CM

## 2013-12-19 DIAGNOSIS — E785 Hyperlipidemia, unspecified: Secondary | ICD-10-CM

## 2013-12-19 DIAGNOSIS — G43009 Migraine without aura, not intractable, without status migrainosus: Secondary | ICD-10-CM

## 2013-12-19 DIAGNOSIS — Z0000 Encounter for general adult medical examination without abnormal findings: Secondary | ICD-10-CM

## 2013-12-19 DIAGNOSIS — Z23 Encounter for immunization: Secondary | ICD-10-CM

## 2013-12-19 DIAGNOSIS — Z1231 Encounter for screening mammogram for malignant neoplasm of breast: Secondary | ICD-10-CM

## 2013-12-19 LAB — BASIC METABOLIC PANEL
BUN: 15 mg/dL (ref 6–23)
CALCIUM: 9 mg/dL (ref 8.4–10.5)
CO2: 25 meq/L (ref 19–32)
CREATININE: 1 mg/dL (ref 0.4–1.2)
Chloride: 106 mEq/L (ref 96–112)
GFR: 61.71 mL/min (ref >60.00)
Glucose, Bld: 97 mg/dL (ref 70–99)
Potassium: 4 mEq/L (ref 3.5–5.1)
SODIUM: 142 meq/L (ref 135–145)

## 2013-12-19 LAB — HEPATIC FUNCTION PANEL
ALT: 12 U/L (ref 0–35)
AST: 15 U/L (ref 0–37)
Albumin: 3.8 g/dL (ref 3.5–5.2)
Alkaline Phosphatase: 57 U/L (ref 39–117)
BILIRUBIN DIRECT: 0 mg/dL (ref 0.0–0.3)
BILIRUBIN TOTAL: 0.8 mg/dL (ref 0.2–1.2)
Total Protein: 7.7 g/dL (ref 6.0–8.3)

## 2013-12-19 LAB — CBC WITH DIFFERENTIAL/PLATELET
BASOS PCT: 0.7 % (ref 0.0–3.0)
Basophils Absolute: 0.1 10*3/uL (ref 0.0–0.1)
EOS PCT: 1.4 % (ref 0.0–5.0)
Eosinophils Absolute: 0.1 10*3/uL (ref 0.0–0.7)
HEMATOCRIT: 46.7 % — AB (ref 36.0–46.0)
Hemoglobin: 15.6 g/dL — ABNORMAL HIGH (ref 12.0–15.0)
Lymphocytes Relative: 20.4 % (ref 12.0–46.0)
Lymphs Abs: 1.9 10*3/uL (ref 0.7–4.0)
MCHC: 33.4 g/dL (ref 30.0–36.0)
MCV: 84.6 fl (ref 78.0–100.0)
MONO ABS: 0.4 10*3/uL (ref 0.1–1.0)
Monocytes Relative: 4.7 % (ref 3.0–12.0)
Neutro Abs: 6.8 10*3/uL (ref 1.4–7.7)
Neutrophils Relative %: 72.8 % (ref 43.0–77.0)
Platelets: 274 10*3/uL (ref 150.0–400.0)
RBC: 5.51 Mil/uL — AB (ref 3.87–5.11)
RDW: 12.8 % (ref 11.5–15.5)
WBC: 9.3 10*3/uL (ref 4.0–10.5)

## 2013-12-19 LAB — LIPID PANEL
CHOL/HDL RATIO: 5
Cholesterol: 192 mg/dL (ref 0–200)
HDL: 37.8 mg/dL — AB (ref >39.00)
LDL Cholesterol: 114 mg/dL — ABNORMAL HIGH (ref 0–99)
NONHDL: 154.2
Triglycerides: 199 mg/dL — ABNORMAL HIGH (ref 0.0–149.0)
VLDL: 39.8 mg/dL (ref 0.0–40.0)

## 2013-12-19 LAB — TSH: TSH: 1.62 u[IU]/mL (ref 0.35–4.50)

## 2013-12-19 MED ORDER — SUMATRIPTAN SUCCINATE 50 MG PO TABS: 50.0000 mg | ORAL_TABLET | Freq: Once | ORAL | Status: DC

## 2013-12-19 NOTE — Patient Instructions (Signed)
Follow up in 1 year or as needed We'll notify you of your lab results and make any changes if needed Take Excedrin Migraine or something similar at the onset of headache.  If no better in 20-30 minutes, please take the Imitrex as directed Drink plenty of fluids Try and get regular exercise We'll call you with your mammo and GI appts Call with any questions or concerns Keep up the good work!  You look great!

## 2013-12-19 NOTE — Progress Notes (Signed)
   Subjective:    Patient ID: Holly Saunders, female    DOB: [birth year], 43 y.o.   MRN: [id number]  HPI Here today for CPE.  Risk Factors: HTN- chronic problem.  Currently controlled w/ healthy diet and regular exercise.  Pt has lost 11 lbs since last visit. Hyperlipidemia- chronic problem, currently diet controlled.  Not on meds. Migraines- chronic problem for pt.  Pt previously had migraine med.  Not occuring frequently.   Physical Activity: limited exercise Fall Risk: low risk Depression: pt w/ hx of similar but no current sxs Hearing: normal to conversational tones and whispered voice at 6 ft ADL's: independent Cognitive: normal linear thought process, memory and attention intact.  Some baseline cognitive impairment due to CP Home Safety: safe at home, back home w/ mom and dad Height, Weight, BMI, Visual Acuity: see vitals, vision corrected to 20/20 w/ glasses Counseling: due for mammo- would like referral to [facility].  No need for paps due to hysterectomy Labs Ordered: See A&P Care Plan: See A&P    Review of Systems Patient reports no vision/ hearing changes, adenopathy,fever, weight change,  persistant/recurrent hoarseness , swallowing issues, chest pain, palpitations, edema, persistant/recurrent cough, hemoptysis, dyspnea (rest/exertional/paroxysmal nocturnal), gastrointestinal bleeding (melena, rectal bleeding), significant heartburn, GU symptoms (dysuria, hematuria, incontinence), Gyn symptoms (abnormal  bleeding, pain), syncope, focal weakness, memory loss, numbness & tingling, skin/hair/nail changes, abnormal bruising or bleeding, anxiety, or depression.  + constipation alternating w/ diarrhea w/ some abd pain- mom is asking for referral back to Dr Henrene Pastor.  Taking fiber supplement.    Objective:   Physical Exam General Appearance:    Alert, cooperative, no distress, appears stated age  Head:    Normocephalic, without obvious abnormality, atraumatic  Eyes:    PERRL,  conjunctiva/corneas clear, EOM's intact, fundi    benign, both eyes  Ears:    Normal TM's and external ear canals, both ears  Nose:   Nares normal, septum midline, mucosa normal, no drainage    or sinus tenderness  Throat:   Lips, mucosa, and tongue normal; teeth and gums normal  Neck:   Supple, symmetrical, trachea midline, no adenopathy;    Thyroid: no enlargement/tenderness/nodules  Back:     Symmetric, no curvature, ROM normal, no CVA tenderness  Lungs:     Clear to auscultation bilaterally, respirations unlabored  Chest Wall:    No tenderness or deformity   Heart:    Regular rate and rhythm, S1 and S2 normal, no murmur, rub   or gallop  Breast Exam:    Deferred to GYN  Abdomen:     Soft, non-tender, bowel sounds active all four quadrants,    no masses, no organomegaly  Genitalia:    Deferred to GYN  Rectal:    Extremities:   Extremities normal, atraumatic, no cyanosis or edema  Pulses:   2+ and symmetric all extremities  Skin:   Skin color, texture, turgor normal, no rashes or lesions  Lymph nodes:   Cervical, supraclavicular, and axillary nodes normal  Neurologic:   Spastic gait, hyper-reflexic R>L          Assessment & Plan:

## 2013-12-19 NOTE — Progress Notes (Signed)
Pre visit review using our clinic review tool, if applicable. No additional management support is needed unless otherwise documented below in the visit note. 

## 2013-12-20 ENCOUNTER — Encounter: Payer: Self-pay | Admitting: Nurse Practitioner

## 2013-12-20 NOTE — Assessment & Plan Note (Signed)
Chronic problem.  Start Miralax prn.  Refer to GI at pt and mom's request.

## 2013-12-20 NOTE — Assessment & Plan Note (Signed)
Improved.  No current sxs.  Will continue to follow.

## 2013-12-20 NOTE — Assessment & Plan Note (Signed)
Pt's PE unchanged from previous.  No need for paps due to hysterectomy.  Referral for mammo entered.  Too young for colonoscopy.  Written screening schedule updated and given to pt.  Check labs.  Anticipatory guidance provided.

## 2013-12-20 NOTE — Assessment & Plan Note (Signed)
Chronic problem.  Unchanged.  Handicapped form completed for pt

## 2013-12-20 NOTE — Assessment & Plan Note (Signed)
Chronic problem.  Pt is attempting to control w/ healthy diet and limited exercise.  Check labs.  Start meds prn.

## 2013-12-20 NOTE — Assessment & Plan Note (Signed)
Chronic problem.  Adequate control.  Asymptomatic.  Check labs.  No need for meds at this time as pt continues to lose weight by monitoring diet.

## 2013-12-20 NOTE — Assessment & Plan Note (Signed)
Chronic problem for pt.  Previously on Imitrex.  Would like to restart prn.  Script given.

## 2014-01-04 ENCOUNTER — Ambulatory Visit (INDEPENDENT_AMBULATORY_CARE_PROVIDER_SITE_OTHER): Payer: Medicare HMO | Admitting: Nurse Practitioner

## 2014-01-04 ENCOUNTER — Encounter: Payer: Self-pay | Admitting: Nurse Practitioner

## 2014-01-04 DIAGNOSIS — K5909 Other constipation: Secondary | ICD-10-CM

## 2014-01-04 DIAGNOSIS — K5900 Constipation, unspecified: Secondary | ICD-10-CM

## 2014-01-04 DIAGNOSIS — R194 Change in bowel habit: Secondary | ICD-10-CM

## 2014-01-04 DIAGNOSIS — G809 Cerebral palsy, unspecified: Secondary | ICD-10-CM

## 2014-01-04 MED ORDER — MOVIPREP 100 G PO SOLR: 1.0000 | Freq: Once | ORAL | Status: DC

## 2014-01-04 NOTE — Progress Notes (Signed)
HPI :  Patient is 43 year old female referred by PCP for evaluation of constipation. She underwent a cholecystectomy in March of this year. Gallbladder pathology compatible with chronic cholecystitis with cholelithiasis.  Patient has cerebral palsy but is ambulatory and seems pretty self-sufficient. She is accompanied by her mother today. It is a little difficult for patient to express herself and give details, mother helps with history. Patient has a history of chronic constipation. Since cholecystectomy in March she has had intermittent loose stool with urgency. Apart from that, constipation has worsened over the last 6 months despite daily Metamucil. Patient often strains with defecation, stools are hard. She has cramps preceding bowel movements. Patient has been trying to lose weight and has successfully lost around 15 pounds. No nausea or vomiting. No blood in her stool. Mother has irritable bowel syndrome, followed by Dr. Henrene Pastor. Mother would like daughter to be under Dr. Blanch Media care.  Past Medical History  Diagnosis Date  . Hyperlipidemia   . Cardiac murmur   . Colon polyps   . Cerebral palsy   . Seasonal allergies   . Ear infection     recurrent  . Hypertension     not being treated for it at present time  . Headache(784.0)     migraines  . PONV (postoperative nausea and vomiting)   . Cerebral palsy     Family History  Problem Relation Age of Onset  . Hypertension Mother   . Hyperlipidemia Mother   . Colon polyps Mother   . Ulcerative colitis Father   . Irritable bowel syndrome Mother    History  Substance Use Topics  . Smoking status: Never Smoker   . Smokeless tobacco: Never Used  . Alcohol Use: Yes     Comment: occasionally   Current Outpatient Prescriptions  Medication Sig Dispense Refill  . psyllium (METAMUCIL) 58.6 % packet Take 1 packet by mouth as directed.    . SUMAtriptan (IMITREX) 50 MG tablet Take 1 tablet (50 mg total) by mouth once. May repeat in 2  hours if headache persists or recurs. 10 tablet 3   No current facility-administered medications for this visit.   Allergies  Allergen Reactions  . Codeine     constipation    Review of Systems: Positive for headaches. All other systems reviewed and negative except where noted in HPI.   Physical Exam: There were no vitals taken for this visit. Constitutional: Pleasant,well-developed, white female in no acute distress. HEENT: Normocephalic and atraumatic. Conjunctivae are normal. No scleral icterus. Neck supple.  Cardiovascular: Normal rate, regular rhythm.  Pulmonary/chest: Effort normal and breath sounds normal. No wheezing, rales or rhonchi. Abdominal: Soft, nondistended, nontender. Bowel sounds active throughout. There are no masses palpable. No hepatomegaly. Extremities: no edema Lymphadenopathy: No cervical adenopathy noted. Neurological: Alert and oriented to person place and time. Skin: Skin is warm and dry. No rashes noted. Psychiatric: Normal mood and affect. Behavior is normal.   ASSESSMENT AND PLAN:  80. 43 year old female with cerebral palsy. Patient is ambulatory, appears fairly independent.   2. Chronic constipation, progressive over last 6 months. Patient takes daily fiber but still strains with BMs. She has left-sided abdominal pain preceding bowel movements. Suspect constipation predominant irritable bowel syndrome. Patient has had some intermittent loose stools with urgency since cholecystectomy in March but constipation is predominant problem.   Recommend daily MiraLAX.   For evaluation of progressive constipation and history of colon polyps at age 67 (polyp pathology unknown) will schedule patient for  colonoscopy. The risks, benefits, and alternatives to colonoscopy with possible biopsy and possible polypectomy were discussed with the patient and her mother and patient consents to proceed. Patient's mother sees Dr. Henrene Pastor and requests that daughter be under his  care as well.

## 2014-01-04 NOTE — Patient Instructions (Signed)
You have been scheduled for a colonoscopy. Please follow written instructions given to you at your visit today.  Please pick up your prep kit at the pharmacy within the next 1-3 days. If you use inhalers (even only as needed), please bring them with you on the day of your procedure. However, you should still follow specific instructions given to you by our office regarding your preparation for the procedure.  Please purchase Miralax over the counter and mix in eight ounces of juice or water, drink once daily

## 2014-01-05 NOTE — Progress Notes (Signed)
Agree with initial assessment and plans 

## 2014-01-10 ENCOUNTER — Telehealth: Payer: Self-pay | Admitting: Internal Medicine

## 2014-01-18 NOTE — Telephone Encounter (Signed)
Spoke with patient's mother and told her I would leave a free voucher for Moviprep up front for her to pick up.  She acknowledged and understood.

## 2014-02-02 ENCOUNTER — Ambulatory Visit (AMBULATORY_SURGERY_CENTER): Payer: Medicare HMO | Admitting: Internal Medicine

## 2014-02-02 ENCOUNTER — Encounter: Payer: Self-pay | Admitting: Internal Medicine

## 2014-02-02 VITALS — BP 125/75 | HR 59 | Temp 95.8°F | Resp 14 | Ht 66.0 in | Wt 187.0 lb

## 2014-02-02 DIAGNOSIS — D120 Benign neoplasm of cecum: Secondary | ICD-10-CM

## 2014-02-02 DIAGNOSIS — K5909 Other constipation: Secondary | ICD-10-CM

## 2014-02-02 DIAGNOSIS — K5900 Constipation, unspecified: Secondary | ICD-10-CM

## 2014-02-02 MED ORDER — SODIUM CHLORIDE 0.9 % IV SOLN: 500.0000 mL | INTRAVENOUS | Status: DC

## 2014-02-02 NOTE — Progress Notes (Signed)
Called to room to assist during endoscopic procedure.  Patient ID and intended procedure confirmed with present staff. Received instructions for my participation in the procedure from the performing physician.  

## 2014-02-02 NOTE — Progress Notes (Signed)
Report to PACU, RN, vss, BBS= Clear.  

## 2014-02-02 NOTE — Progress Notes (Signed)
Pt has cerebral palsey and will need assistance with turning over and walking.  A fall risk sign is hanging on the IV pole. maw

## 2014-02-02 NOTE — Patient Instructions (Signed)
YOU HAD AN ENDOSCOPIC PROCEDURE TODAY AT THE [facility]: Refer to the procedure report that was given to you for any specific questions about what was found during the examination.  If the procedure report does not answer your questions, please call your gastroenterologist to clarify.  If you requested that your care partner not be given the details of your procedure findings, then the procedure report has been included in a sealed envelope for you to review at your convenience later.  YOU SHOULD EXPECT: Some feelings of bloating in the abdomen. Passage of more gas than usual.  Walking can help get rid of the air that was put into your GI tract during the procedure and reduce the bloating. If you had a lower endoscopy (such as a colonoscopy or flexible sigmoidoscopy) you may notice spotting of blood in your stool or on the toilet paper. If you underwent a bowel prep for your procedure, then you may not have a normal bowel movement for a few days.  DIET: Your first meal following the procedure should be a light meal and then it is ok to progress to your normal diet.  A half-sandwich or bowl of soup is an example of a good first meal.  Heavy or fried foods are harder to digest and may make you feel nauseous or bloated.  Likewise meals heavy in dairy and vegetables can cause extra gas to form and this can also increase the bloating.  Drink plenty of fluids but you should avoid alcoholic beverages for 24 hours.  ACTIVITY: Your care partner should take you home directly after the procedure.  You should plan to take it easy, moving slowly for the rest of the day.  You can resume normal activity the day after the procedure however you should NOT DRIVE or use heavy machinery for 24 hours (because of the sedation medicines used during the test).    SYMPTOMS TO REPORT IMMEDIATELY: A gastroenterologist can be reached at any hour.  During normal business hours, 8:30 AM to 5:00 PM Monday through Friday,  call [phone].  After hours and on weekends, please call the GI answering service at [phone] who will take a message and have the physician on call contact you.   Following lower endoscopy (colonoscopy or flexible sigmoidoscopy):  Excessive amounts of blood in the stool  Significant tenderness or worsening of abdominal pains  Swelling of the abdomen that is new, acute  Fever of 100F or higher  FOLLOW UP: If any biopsies were taken you will be contacted by phone or by letter within the next 1-3 weeks.  Call your gastroenterologist if you have not heard about the biopsies in 3 weeks.  Our staff will call the home number listed on your records the next business day following your procedure to check on you and address any questions or concerns that you may have at that time regarding the information given to you following your procedure. This is a courtesy call and so if there is no answer at the home number and we have not heard from you through the emergency physician on call, we will assume that you have returned to your regular daily activities without incident.  SIGNATURES/CONFIDENTIALITY: You and/or your care partner have signed paperwork which will be entered into your electronic medical record.  These signatures attest to the fact that that the information above on your After Visit Summary has been reviewed and is understood.  Full responsibility of the confidentiality of this   discharge information lies with you and/or your care-partner.  Please, use your miralax and read the handouts given to you by your recovery room nurse.

## 2014-02-02 NOTE — Op Note (Signed)
[facility]  [address] [place], [id number]   COLONOSCOPY PROCEDURE REPORT  PATIENT: Holly, Saunders  MR#: [id number] BIRTHDATE: 03-Apr-1970 , 51  yrs. old GENDER: female ENDOSCOPIST: Eustace Quail, MD REFERRED [id number] Birdie Riddle, M.D. PROCEDURE DATE:  02/02/2014 PROCEDURE:   Colonoscopy with snare polypectomy x 1 First Screening Colonoscopy - Avg.  risk and is 50 yrs.  old or older - No.  Prior Negative Screening - Now for repeat screening. N/A  History of Adenoma - Now for follow-up colonoscopy & has been > or = to 3 yrs.  N/A  Polyps Removed Today? Yes. ASA CLASS:   Class II INDICATIONS:change in bowel habits (progressive constipation) and surveillance colonoscopy based on a reported history of colonic polyp(s)-remote, no details.Marland Kitchen MEDICATIONS: Monitored anesthesia care and Propofol 200 mg IV  DESCRIPTION OF PROCEDURE:   After the risks benefits and alternatives of the procedure were thoroughly explained, informed consent was obtained.  The digital rectal exam revealed no abnormalities of the rectum.   The LB [id number]  endoscope was introduced through the anus and advanced to the cecum, which was identified by both the appendix and ileocecal valve. No adverse events experienced.   The quality of the prep was excellent, using MoviPrep  The instrument was then slowly withdrawn as the colon was fully examined.   COLON FINDINGS: A single polyp measuring 2 mm in size was found at the cecum.  A polypectomy was performed with a cold snare.  The resection was complete, the polyp tissue was completely retrieved and sent to histology.   The examination was otherwise normal. Retroflexed views revealed no abnormalities. The time to cecum=2 minutes 24 seconds.  Withdrawal time=9 minutes 18 seconds.  The scope was withdrawn and the procedure completed. COMPLICATIONS: There were no immediate complications.  ENDOSCOPIC IMPRESSION: 1.   Single polyp  measuring 2 mm in size was found at the cecum; polypectomy was performed with a cold snare 2.   The examination was otherwise normal  RECOMMENDATIONS: 1.  Repeat colonoscopy in 5 years if polyp adenomatous; otherwise 10 years 2.  Continue MiraLAX for constipation as needed  eSigned:  Eustace Quail, MD 02/02/2014 4:42 PM   cc: The Patient and Midge Minium, MD

## 2014-02-03 ENCOUNTER — Telehealth: Payer: Self-pay | Admitting: *Deleted

## 2014-02-03 NOTE — Telephone Encounter (Signed)
  Follow up Call-  Call back number 02/02/2014  Post procedure Call Back phone  # [phone] hm   Permission to leave phone message Yes     Patient questions:  Do you have a fever, pain , or abdominal swelling? No. Pain Score  0 *  Have you tolerated food without any problems? Yes.    Have you been able to return to your normal activities? Yes.    Do you have any questions about your discharge instructions: Diet   No. Medications  No. Follow up visit  No.  Do you have questions or concerns about your Care? No.  Actions: * If pain score is 4 or above: No action needed, pain <4.

## 2014-02-08 ENCOUNTER — Encounter: Payer: Self-pay | Admitting: Internal Medicine

## 2014-02-15 ENCOUNTER — Ambulatory Visit: Payer: Medicare HMO

## 2014-02-22 ENCOUNTER — Ambulatory Visit (INDEPENDENT_AMBULATORY_CARE_PROVIDER_SITE_OTHER): Payer: Commercial Managed Care - HMO

## 2014-02-22 DIAGNOSIS — Z1231 Encounter for screening mammogram for malignant neoplasm of breast: Secondary | ICD-10-CM

## 2014-03-07 ENCOUNTER — Other Ambulatory Visit (INDEPENDENT_AMBULATORY_CARE_PROVIDER_SITE_OTHER): Payer: Commercial Managed Care - HMO

## 2014-03-07 DIAGNOSIS — D582 Other hemoglobinopathies: Secondary | ICD-10-CM

## 2014-03-07 LAB — CBC WITH DIFFERENTIAL/PLATELET
BASOS PCT: 0.8 % (ref 0.0–3.0)
Basophils Absolute: 0.1 10*3/uL (ref 0.0–0.1)
EOS ABS: 0.2 10*3/uL (ref 0.0–0.7)
Eosinophils Relative: 2.4 % (ref 0.0–5.0)
HEMATOCRIT: 44.7 % (ref 36.0–46.0)
HEMOGLOBIN: 15.1 g/dL — AB (ref 12.0–15.0)
Lymphocytes Relative: 29 % (ref 12.0–46.0)
Lymphs Abs: 2.4 10*3/uL (ref 0.7–4.0)
MCHC: 33.9 g/dL (ref 30.0–36.0)
MCV: 83.5 fl (ref 78.0–100.0)
MONOS PCT: 5.7 % (ref 3.0–12.0)
Monocytes Absolute: 0.5 10*3/uL (ref 0.1–1.0)
NEUTROS ABS: 5.1 10*3/uL (ref 1.4–7.7)
Neutrophils Relative %: 62.1 % (ref 43.0–77.0)
PLATELETS: 262 10*3/uL (ref 150.0–400.0)
RBC: 5.35 Mil/uL — ABNORMAL HIGH (ref 3.87–5.11)
RDW: 12.2 % (ref 11.5–15.5)
WBC: 8.2 10*3/uL (ref 4.0–10.5)

## 2014-05-19 DIAGNOSIS — H52209 Unspecified astigmatism, unspecified eye: Secondary | ICD-10-CM | POA: Diagnosis not present

## 2014-05-19 DIAGNOSIS — H5203 Hypermetropia, bilateral: Secondary | ICD-10-CM | POA: Diagnosis not present

## 2014-05-19 DIAGNOSIS — Z0100 Encounter for examination of eyes and vision without abnormal findings: Secondary | ICD-10-CM | POA: Diagnosis not present

## 2014-05-19 DIAGNOSIS — H5210 Myopia, unspecified eye: Secondary | ICD-10-CM | POA: Diagnosis not present

## 2014-05-19 DIAGNOSIS — H5212 Myopia, left eye: Secondary | ICD-10-CM | POA: Diagnosis not present

## 2015-04-16 ENCOUNTER — Other Ambulatory Visit: Payer: Self-pay | Admitting: Family Medicine

## 2015-04-16 DIAGNOSIS — Z1231 Encounter for screening mammogram for malignant neoplasm of breast: Secondary | ICD-10-CM

## 2015-04-25 ENCOUNTER — Ambulatory Visit (INDEPENDENT_AMBULATORY_CARE_PROVIDER_SITE_OTHER): Payer: Commercial Managed Care - HMO

## 2015-04-25 DIAGNOSIS — R928 Other abnormal and inconclusive findings on diagnostic imaging of breast: Secondary | ICD-10-CM

## 2015-04-25 DIAGNOSIS — Z1231 Encounter for screening mammogram for malignant neoplasm of breast: Secondary | ICD-10-CM

## 2015-04-27 ENCOUNTER — Other Ambulatory Visit: Payer: Self-pay | Admitting: Family Medicine

## 2015-04-27 ENCOUNTER — Telehealth: Payer: Self-pay | Admitting: *Deleted

## 2015-04-27 DIAGNOSIS — R928 Other abnormal and inconclusive findings on diagnostic imaging of breast: Secondary | ICD-10-CM

## 2015-04-27 NOTE — Telephone Encounter (Signed)
Received Physician Order; forwarded to provider/SLS 02/24

## 2015-05-07 ENCOUNTER — Inpatient Hospital Stay: Admission: RE | Admit: 2015-05-07 | Payer: Commercial Managed Care - HMO | Source: Ambulatory Visit

## 2015-05-07 ENCOUNTER — Other Ambulatory Visit: Payer: Commercial Managed Care - HMO

## 2015-06-19 ENCOUNTER — Other Ambulatory Visit: Payer: Commercial Managed Care - HMO

## 2015-07-03 ENCOUNTER — Ambulatory Visit
Admission: RE | Admit: 2015-07-03 | Discharge: 2015-07-03 | Disposition: A | Discharge status: Home / Self Care | Payer: Commercial Managed Care - HMO | Source: Ambulatory Visit | Attending: Family Medicine | Admitting: Family Medicine

## 2015-07-03 DIAGNOSIS — R928 Other abnormal and inconclusive findings on diagnostic imaging of breast: Secondary | ICD-10-CM

## 2016-01-16 ENCOUNTER — Telehealth: Payer: Self-pay

## 2016-01-16 NOTE — Telephone Encounter (Signed)
Called to confirm PCP and make f/u appointment. No answer, no voicemail available.

## 2016-03-14 ENCOUNTER — Telehealth: Payer: Self-pay | Admitting: Family Medicine

## 2016-03-14 NOTE — Telephone Encounter (Signed)
That is fine with me. Happy to help

## 2016-03-14 NOTE — Telephone Encounter (Signed)
I am ok with her seeing Einar Pheasant for this but only if Einar Pheasant is ok w/ it

## 2016-03-14 NOTE — Telephone Encounter (Signed)
Thank you, I will call mom and schedule appt.

## 2016-03-14 NOTE — Telephone Encounter (Signed)
Mom calling wanting to schedule cpe for pt by 1/20 and to have forms completed for [employer]. Pt has not seen you since 2015. Is this ok to schedule with Einar Pheasant? I know you have nothing until May.

## 2016-03-18 ENCOUNTER — Ambulatory Visit: Payer: Commercial Managed Care - HMO | Admitting: Physician Assistant

## 2016-03-19 ENCOUNTER — Ambulatory Visit: Payer: Commercial Managed Care - HMO | Admitting: Physician Assistant

## 2016-03-21 DIAGNOSIS — G809 Cerebral palsy, unspecified: Secondary | ICD-10-CM | POA: Diagnosis not present

## 2016-03-21 DIAGNOSIS — R739 Hyperglycemia, unspecified: Secondary | ICD-10-CM | POA: Diagnosis not present

## 2016-03-21 DIAGNOSIS — R440 Auditory hallucinations: Secondary | ICD-10-CM | POA: Diagnosis not present

## 2016-03-21 DIAGNOSIS — R443 Hallucinations, unspecified: Secondary | ICD-10-CM | POA: Diagnosis not present

## 2016-03-21 DIAGNOSIS — R4182 Altered mental status, unspecified: Secondary | ICD-10-CM | POA: Diagnosis not present

## 2016-03-21 DIAGNOSIS — F333 Major depressive disorder, recurrent, severe with psychotic symptoms: Secondary | ICD-10-CM | POA: Diagnosis not present

## 2016-03-21 DIAGNOSIS — E785 Hyperlipidemia, unspecified: Secondary | ICD-10-CM | POA: Diagnosis not present

## 2016-03-21 DIAGNOSIS — I10 Essential (primary) hypertension: Secondary | ICD-10-CM | POA: Diagnosis not present

## 2016-03-21 DIAGNOSIS — F329 Major depressive disorder, single episode, unspecified: Secondary | ICD-10-CM | POA: Diagnosis not present

## 2016-03-22 DIAGNOSIS — G809 Cerebral palsy, unspecified: Secondary | ICD-10-CM | POA: Diagnosis not present

## 2016-03-22 DIAGNOSIS — F329 Major depressive disorder, single episode, unspecified: Secondary | ICD-10-CM | POA: Diagnosis not present

## 2016-03-22 DIAGNOSIS — R443 Hallucinations, unspecified: Secondary | ICD-10-CM | POA: Diagnosis not present

## 2016-03-24 DIAGNOSIS — F99 Mental disorder, not otherwise specified: Secondary | ICD-10-CM | POA: Diagnosis not present

## 2016-03-25 ENCOUNTER — Ambulatory Visit (INDEPENDENT_AMBULATORY_CARE_PROVIDER_SITE_OTHER): Payer: Medicare HMO | Admitting: Physician Assistant

## 2016-03-25 ENCOUNTER — Encounter: Payer: Self-pay | Admitting: Physician Assistant

## 2016-03-25 VITALS — BP 130/82 | HR 77 | Temp 98.8°F | Resp 16 | Ht 66.0 in | Wt 200.0 lb

## 2016-03-25 DIAGNOSIS — Z23 Encounter for immunization: Secondary | ICD-10-CM | POA: Diagnosis not present

## 2016-03-25 DIAGNOSIS — Z0000 Encounter for general adult medical examination without abnormal findings: Secondary | ICD-10-CM

## 2016-03-25 DIAGNOSIS — I10 Essential (primary) hypertension: Secondary | ICD-10-CM

## 2016-03-25 NOTE — Patient Instructions (Signed)
Please go to the lab for blood work. I will call you with your results.  Please start OTC Melatonin 3 mg supplement in the evening to help with sleep.  Follow-up with Psychiatry as scheduled.    Preventive Care 40-64 Years, Female Preventive care refers to lifestyle choices and visits with your health care provider that can promote health and wellness. What does preventive care include?  A yearly physical exam. This is also called an annual well check.  Dental exams once or twice a year.  Routine eye exams. Ask your health care provider how often you should have your eyes checked.  Personal lifestyle choices, including:  Daily care of your teeth and gums.  Regular physical activity.  Eating a healthy diet.  Avoiding tobacco and drug use.  Limiting alcohol use.  Practicing safe sex.  Taking low-dose aspirin daily starting at age 18.  Taking vitamin and mineral supplements as recommended by your health care provider. What happens during an annual well check? The services and screenings done by your health care provider during your annual well check will depend on your age, overall health, lifestyle risk factors, and family history of disease. Counseling  Your health care provider may ask you questions about your:  Alcohol use.  Tobacco use.  Drug use.  Emotional well-being.  Home and relationship well-being.  Sexual activity.  Eating habits.  Work and work Astron[employer].  Method of birth control.  Menstrual cycle.  Pregnancy history. Screening  You may have the following tests or measurements:  Height, weight, and BMI.  Blood pressure.  Lipid and cholesterol levels. These may be checked every 5 years, or more frequently if you are over 44 years old.  Skin check.  Lung cancer screening. You may have this screening every year starting at age 2 if you have a 30-pack-year history of smoking and currently smoke or have quit within the past 15  years.  Fecal occult blood test (FOBT) of the stool. You may have this test every year starting at age 43.  Flexible sigmoidoscopy or colonoscopy. You may have a sigmoidoscopy every 5 years or a colonoscopy every 10 years starting at age 32.  Hepatitis C blood test.  Hepatitis B blood test.  Sexually transmitted disease (STD) testing.  Diabetes screening. This is done by checking your blood sugar (glucose) after you have not eaten for a while (fasting). You may have this done every 1-3 years.  Mammogram. This may be done every 1-2 years. Talk to your health care provider about when you should start having regular mammograms. This may depend on whether you have a family history of breast cancer.  BRCA-related cancer screening. This may be done if you have a family history of breast, ovarian, tubal, or peritoneal cancers.  Pelvic exam and Pap test. This may be done every 3 years starting at age 58. Starting at age 11, this may be done every 5 years if you have a Pap test in combination with an HPV test.  Bone density scan. This is done to screen for osteoporosis. You may have this scan if you are at high risk for osteoporosis. Discuss your test results, treatment options, and if necessary, the need for more tests with your health care provider. Vaccines  Your health care provider may recommend certain vaccines, such as:  Influenza vaccine. This is recommended every year.  Tetanus, diphtheria, and acellular pertussis (Tdap, Td) vaccine. You may need a Td booster every 10 years.  Varicella vaccine. You  may need this if you have not been vaccinated.  Zoster vaccine. You may need this after age 68.  Measles, mumps, and rubella (MMR) vaccine. You may need at least one dose of MMR if you were born in 1957 or later. You may also need a second dose.  Pneumococcal 13-valent conjugate (PCV13) vaccine. You may need this if you have certain conditions and were not previously  vaccinated.  Pneumococcal polysaccharide (PPSV23) vaccine. You may need one or two doses if you smoke cigarettes or if you have certain conditions.  Meningococcal vaccine. You may need this if you have certain conditions.  Hepatitis A vaccine. You may need this if you have certain conditions or if you travel or work in places where you may be exposed to hepatitis A.  Hepatitis B vaccine. You may need this if you have certain conditions or if you travel or work in places where you may be exposed to hepatitis B.  Haemophilus influenzae type b (Hib) vaccine. You may need this if you have certain conditions. Talk to your health care provider about which screenings and vaccines you need and how often you need them. This information is not intended to replace advice given to you by your health care provider. Make sure you discuss any questions you have with your health care provider. Document Released: 03/16/2015 Document Revised: 11/07/2015 Document Reviewed: 12/19/2014 Elsevier Interactive Patient Education  2017 [employer].

## 2016-03-25 NOTE — Progress Notes (Signed)
Pre visit review using our clinic review tool, if applicable. No additional management support is needed unless otherwise documented below in the visit note. 

## 2016-03-25 NOTE — Progress Notes (Signed)
Patient presents to clinic today for medicare wellness and annual examination  Patient is fasting for labs. Patient's mother is present for interview and examination. Patient with history of Cerebral Palsy.    Patient with history of hypertension and hyperlipidemia. Is currently controlled with diet and lifestyle changes. Patient denies chest pain, palpitations, lightheadedness, dizziness, vision changes or frequent headaches.  BP Readings from Last 3 Encounters:  03/25/16 130/82  02/02/14 125/75  12/19/13 130/80   Health Maintenance: Immunizations -- Flu and Tetanus up-to-date.  Colonoscopy -- 2015 PAP -- s/p hysterectomy.  Past Medical History:  Diagnosis Date  . Allergy   . Anxiety   . Cardiac murmur   . Cerebral palsy ([place])   . Cerebral palsy ([place])   . Colon polyps   . Ear infection    recurrent  . Headache(784.0)    migraines  . Hyperlipidemia   . Hypertension    not being treated for it at present time  . PONV (postoperative nausea and vomiting)   . Seasonal allergies     Past Surgical History:  Procedure Laterality Date  . ABDOMINAL HYSTERECTOMY    . CHOLECYSTECTOMY N/A 05/03/2013   Procedure: LAPAROSCOPIC CHOLECYSTECTOMY WITH INTRAOPERATIVE CHOLANGIOGRAM;  Surgeon: Joyice Faster. Cornett, MD;  Location: [facility];  Service: General;  Laterality: N/A;  . EYE SURGERY Bilateral    for cross eye  . heel cord surgery Bilateral   . HIP SURGERY     bialteral  . INCISION AND DRAINAGE ABSCESS Right    axilla  . LAPAROSCOPIC SUPRACERVICAL HYSTERECTOMY  01/31/09  . TONSILLECTOMY      Current Outpatient Prescriptions on File Prior to Visit  Medication Sig Dispense Refill  . psyllium (METAMUCIL) 58.6 % packet Take 1 packet by mouth as directed.    . SUMAtriptan (IMITREX) 50 MG tablet Take 1 tablet (50 mg total) by mouth once. May repeat in 2 hours if headache persists or recurs. 10 tablet 3   No current facility-administered medications on file prior to visit.      Allergies  Allergen Reactions  . Codeine     constipation    Family History  Problem Relation Age of Onset  . Hypertension Mother   . Hyperlipidemia Mother   . Colon polyps Mother   . Irritable bowel syndrome Mother   . Ulcerative colitis Father   . Colon cancer Father     not sure, he has a colostomy  . Esophageal cancer Neg Hx   . Rectal cancer Neg Hx   . Stomach cancer Neg Hx     Social History   Social History  . Marital status: Divorced    Spouse name: N/A  . Number of children: N/A  . Years of education: N/A   Occupational History  . Not on file.   Social History Main Topics  . Smoking status: Never Smoker  . Smokeless tobacco: Never Used  . Alcohol use Yes     Comment: occasionally  . Drug use: No  . Sexual activity: No   Other Topics Concern  . Not on file   Social History Narrative  . No narrative on file    Review of Systems  Constitutional: Negative for fever and weight loss.  HENT: Negative for ear discharge, ear pain, hearing loss and tinnitus.   Eyes: Negative for blurred vision, double vision, photophobia and pain.  Respiratory: Negative for cough and shortness of breath.   Cardiovascular: Negative for chest pain and palpitations.  Gastrointestinal: Negative  for abdominal pain, blood in stool, constipation, diarrhea, heartburn, melena, nausea and vomiting.  Genitourinary: Negative for dysuria, flank pain, frequency, hematuria and urgency.  Musculoskeletal: Negative for falls.  Neurological: Negative for dizziness, loss of consciousness and headaches.  Endo/Heme/Allergies: Negative for environmental allergies.  Psychiatric/Behavioral: Negative for depression, hallucinations, substance abuse and suicidal ideas. The patient is not nervous/anxious and does not have insomnia.     BP 130/82   Pulse 77   Temp 98.8 F (37.1 C) (Oral)   Resp 16   Ht 5\' 6"  (1.676 m)   Wt 200 lb (90.7 kg)   SpO2 98%   BMI 32.28 kg/m   Physical Exam   Constitutional: She is oriented to person, place, and time and well-developed, well-nourished, and in no distress.  HENT:  Head: Normocephalic and atraumatic.  Right Ear: Tympanic membrane, external ear and ear canal normal.  Left Ear: Tympanic membrane, external ear and ear canal normal.  Nose: Nose normal. No mucosal edema.  Mouth/Throat: Uvula is midline, oropharynx is clear and moist and mucous membranes are normal. No oropharyngeal exudate or posterior oropharyngeal erythema.  Eyes: Conjunctivae are normal. Pupils are equal, round, and reactive to light.  Neck: Neck supple. No thyromegaly present.  Cardiovascular: Normal rate, regular rhythm, normal heart sounds and intact distal pulses.   Pulmonary/Chest: Effort normal and breath sounds normal. No respiratory distress. She has no wheezes. She has no rales.  Abdominal: Soft. Bowel sounds are normal. She exhibits no distension and no mass. There is no tenderness. There is no rebound and no guarding.  Lymphadenopathy:    She has no cervical adenopathy.  Neurological: She is alert and oriented to person, place, and time. No cranial nerve deficit.  Skin: Skin is warm and dry. No rash noted.  Psychiatric: Affect normal.  Vitals reviewed.   Recent Results (from the past 2160 hour(s))  CBC     Status: Abnormal   Collection Time: 03/25/16  2:57 PM  Result Value Ref Range   WBC 9.8 4.0 - 10.5 K/uL   RBC 5.25 (H) 3.87 - 5.11 Mil/uL   Platelets 268.0 150.0 - 400.0 K/uL   Hemoglobin 15.1 (H) 12.0 - 15.0 g/dL   HCT 03/27/16 33.1 - 25.0 %   MCV 82.7 78.0 - 100.0 fl   MCHC 34.8 30.0 - 36.0 g/dL   RDW 87.1 99.4 - 12.9 %  Comp Met (CMET)     Status: Abnormal   Collection Time: 03/25/16  2:57 PM  Result Value Ref Range   Sodium 139 135 - 145 mEq/L   Potassium 3.4 (L) 3.5 - 5.1 mEq/L   Chloride 103 96 - 112 mEq/L   CO2 30 19 - 32 mEq/L   Glucose, Bld 98 70 - 99 mg/dL   BUN 6 6 - 23 mg/dL   Creatinine, Ser 03/27/16 0.40 - 1.20 mg/dL   Total  Bilirubin 0.7 0.2 - 1.2 mg/dL   Alkaline Phosphatase 56 39 - 117 U/L   AST 10 0 - 37 U/L   ALT 10 0 - 35 U/L   Total Protein 6.9 6.0 - 8.3 g/dL   Albumin 4.3 3.5 - 5.2 g/dL   Calcium 8.9 8.4 - 5.33 mg/dL   GFR 91.7 92.17 mL/min  TSH     Status: None   Collection Time: 03/25/16  2:57 PM  Result Value Ref Range   TSH 3.50 0.35 - 4.50 uIU/mL  Urinalysis, Routine w reflex microscopic     Status: Abnormal   Collection  Time: 03/25/16  2:57 PM  Result Value Ref Range   Color, Urine YELLOW Yellow;Lt. Yellow   APPearance CLEAR Clear   Specific Gravity, Urine 1.015 1.000 - 1.030   pH 6.0 5.0 - 8.0   Total Protein, Urine NEGATIVE Negative   Urine Glucose NEGATIVE Negative   Ketones, ur NEGATIVE Negative   Bilirubin Urine NEGATIVE Negative   Hgb urine dipstick NEGATIVE Negative   Urobilinogen, UA 0.2 0.0 - 1.0   Leukocytes, UA NEGATIVE Negative   Nitrite NEGATIVE Negative   WBC, UA 0-2/hpf 0-2/hpf   RBC / HPF 0-2/hpf 0-2/hpf   Mucus, UA Presence of (A) None   Squamous Epithelial / LPF Rare(0-4/hpf) Rare(0-4/hpf)  Lipid panel     Status: None   Collection Time: 03/25/16  2:57 PM  Result Value Ref Range   Cholesterol 162 0 - 200 mg/dL    Comment: ATP III Classification       Desirable:  < 200 mg/dL               Borderline High:  200 - 239 mg/dL          High:  > = 240 mg/dL   Triglycerides 105.0 0.0 - 149.0 mg/dL    Comment: Normal:  <150 mg/dLBorderline High:  150 - 199 mg/dL   HDL 55.40 >39.00 mg/dL   VLDL 21.0 0.0 - 40.0 mg/dL   LDL Cholesterol 85 0 - 99 mg/dL   Total CHOL/HDL Ratio 3     Comment:                Men          Women1/2 Average Risk     3.4          3.3Average Risk          5.0          4.42X Average Risk          9.6          7.13X Average Risk          15.0          11.0                       NonHDL 106.35     Comment: NOTE:  Non-HDL goal should be 30 mg/dL higher than patient's LDL goal (i.e. LDL goal of < 70 mg/dL, would have non-HDL goal of < 100 mg/dL)    Hemoglobin A1c     Status: None   Collection Time: 03/25/16  2:57 PM  Result Value Ref Range   Hgb A1c MFr Bld 4.9 4.6 - 6.5 %    Comment: Glycemic Control Guidelines for People with Diabetes:Non Diabetic:  <6%Goal of Therapy: <7%Additional Action Suggested:  >8%    Assessment/Plan: Essential hypertension Asymptomatic. Normotensive BP on recheck. Will continue to monitor. Dietary and Exercise recommendations reviewed.  Visit for preventive health examination Depression screen negative. Health Maintenance reviewed -- Immunizations now up-to-date. Colonoscopy in 2015. Repeat 2020. Preventive schedule discussed and handout given in AVS. Will obtain fasting labs today.     Leeanne Rio, PA-C

## 2016-03-26 LAB — COMPREHENSIVE METABOLIC PANEL
ALBUMIN: 4.3 g/dL (ref 3.5–5.2)
ALK PHOS: 56 U/L (ref 39–117)
ALT: 10 U/L (ref 0–35)
AST: 10 U/L (ref 0–37)
BILIRUBIN TOTAL: 0.7 mg/dL (ref 0.2–1.2)
BUN: 6 mg/dL (ref 6–23)
CO2: 30 meq/L (ref 19–32)
Calcium: 8.9 mg/dL (ref 8.4–10.5)
Chloride: 103 mEq/L (ref 96–112)
Creatinine, Ser: 0.89 mg/dL (ref 0.40–1.20)
GFR: 72.31 mL/min (ref >60.00)
Glucose, Bld: 98 mg/dL (ref 70–99)
Potassium: 3.4 mEq/L — ABNORMAL LOW (ref 3.5–5.1)
Sodium: 139 mEq/L (ref 135–145)
TOTAL PROTEIN: 6.9 g/dL (ref 6.0–8.3)

## 2016-03-26 LAB — URINALYSIS, ROUTINE W REFLEX MICROSCOPIC
BILIRUBIN URINE: NEGATIVE
Hgb urine dipstick: NEGATIVE
KETONES UR: NEGATIVE
LEUKOCYTES UA: NEGATIVE
NITRITE: NEGATIVE
Specific Gravity, Urine: 1.015 (ref 1.000–1.030)
Total Protein, Urine: NEGATIVE
Urine Glucose: NEGATIVE
Urobilinogen, UA: 0.2 (ref 0.0–1.0)
pH: 6 (ref 5.0–8.0)

## 2016-03-26 LAB — CBC
HCT: 43.5 % (ref 36.0–46.0)
HEMOGLOBIN: 15.1 g/dL — AB (ref 12.0–15.0)
MCHC: 34.8 g/dL (ref 30.0–36.0)
MCV: 82.7 fl (ref 78.0–100.0)
Platelets: 268 10*3/uL (ref 150.0–400.0)
RBC: 5.25 Mil/uL — AB (ref 3.87–5.11)
RDW: 12.4 % (ref 11.5–15.5)
WBC: 9.8 10*3/uL (ref 4.0–10.5)

## 2016-03-26 LAB — LIPID PANEL
CHOL/HDL RATIO: 3
CHOLESTEROL: 162 mg/dL (ref 0–200)
HDL: 55.4 mg/dL (ref >39.00)
LDL Cholesterol: 85 mg/dL (ref 0–99)
NonHDL: 106.35
Triglycerides: 105 mg/dL (ref 0.0–149.0)
VLDL: 21 mg/dL (ref 0.0–40.0)

## 2016-03-26 LAB — HEMOGLOBIN A1C: HEMOGLOBIN A1C: 4.9 % (ref 4.6–6.5)

## 2016-03-26 LAB — TSH: TSH: 3.5 u[IU]/mL (ref 0.35–4.50)

## 2016-03-27 ENCOUNTER — Other Ambulatory Visit: Payer: Self-pay | Admitting: Emergency Medicine

## 2016-03-27 MED ORDER — MULTI-VITAMIN/MINERALS PO TABS: 1.0000 | ORAL_TABLET | Freq: Every day | ORAL | 0 refills | Status: DC

## 2016-03-28 DIAGNOSIS — G809 Cerebral palsy, unspecified: Secondary | ICD-10-CM | POA: Diagnosis not present

## 2016-03-28 DIAGNOSIS — F322 Major depressive disorder, single episode, severe without psychotic features: Secondary | ICD-10-CM | POA: Diagnosis not present

## 2016-03-28 DIAGNOSIS — I10 Essential (primary) hypertension: Secondary | ICD-10-CM | POA: Diagnosis not present

## 2016-03-28 DIAGNOSIS — F333 Major depressive disorder, recurrent, severe with psychotic symptoms: Secondary | ICD-10-CM | POA: Diagnosis not present

## 2016-03-28 DIAGNOSIS — F332 Major depressive disorder, recurrent severe without psychotic features: Secondary | ICD-10-CM | POA: Diagnosis not present

## 2016-04-04 DIAGNOSIS — F333 Major depressive disorder, recurrent, severe with psychotic symptoms: Secondary | ICD-10-CM | POA: Diagnosis not present

## 2016-04-06 DIAGNOSIS — Z0000 Encounter for general adult medical examination without abnormal findings: Secondary | ICD-10-CM | POA: Insufficient documentation

## 2016-04-06 NOTE — Assessment & Plan Note (Signed)
Asymptomatic. Normotensive BP on recheck. Will continue to monitor. Dietary and Exercise recommendations reviewed.

## 2016-04-06 NOTE — Assessment & Plan Note (Signed)
Depression screen negative. Health Maintenance reviewed -- Immunizations now up-to-date. Colonoscopy in 2015. Repeat 2020. Preventive schedule discussed and handout given in AVS. Will obtain fasting labs today.

## 2016-04-07 ENCOUNTER — Telehealth: Payer: Self-pay | Admitting: Family Medicine

## 2016-04-07 DIAGNOSIS — F333 Major depressive disorder, recurrent, severe with psychotic symptoms: Secondary | ICD-10-CM | POA: Diagnosis not present

## 2016-04-07 NOTE — Telephone Encounter (Signed)
Please advise 

## 2016-04-07 NOTE — Telephone Encounter (Signed)
[facility] has psychiatrists on staff and they should continue to follow her and prescribe these medications- or arrange outpt f/u w/ someone who will.  She will need to discuss this w/ [facility]

## 2016-04-07 NOTE — Telephone Encounter (Signed)
Patient's mother calling to report patient was released from [facility] after seven days of treatment. She is currently completing intensive outpatient therapy, M-F, 9am -3pm.  Please update patient's med list to reflect the following meds that she was discharged with:  Lexapro 20mg  for depression, 1 tablet daily Rifperdal 1mg , 1 at bedtime Desyrel 50mg  for sleep, as needed Vistaril 25mg  for anxiety, every six hours as needed  Mom asking how patient would go about getting refills of these meds when she runs out.  She was given a 30 days supply.

## 2016-04-07 NOTE — Telephone Encounter (Signed)
Patient mom notified of PCP recommendations and is agreement and expresses an understanding.  

## 2016-04-08 DIAGNOSIS — F333 Major depressive disorder, recurrent, severe with psychotic symptoms: Secondary | ICD-10-CM | POA: Diagnosis not present

## 2016-04-09 DIAGNOSIS — F333 Major depressive disorder, recurrent, severe with psychotic symptoms: Secondary | ICD-10-CM | POA: Diagnosis not present

## 2016-04-10 ENCOUNTER — Telehealth: Payer: Self-pay | Admitting: Family Medicine

## 2016-04-10 DIAGNOSIS — G809 Cerebral palsy, unspecified: Secondary | ICD-10-CM

## 2016-04-10 DIAGNOSIS — F333 Major depressive disorder, recurrent, severe with psychotic symptoms: Secondary | ICD-10-CM | POA: Diagnosis not present

## 2016-04-10 NOTE — Telephone Encounter (Signed)
Please advise 

## 2016-04-10 NOTE — Telephone Encounter (Signed)
Mom, Holly Saunders is calling because patient (daughter) needs a "Rollator" brand walker.  Holly Saunders is requesting a script for the walker be mailed to her and says it is covered by medicare.  She also says Holly Saunders is doing wonderful.  Thank you.  -LL

## 2016-04-10 NOTE — Telephone Encounter (Signed)
We can write script for [employer] DME but we don't have a face to face within the last 30 days if this is required

## 2016-04-11 DIAGNOSIS — F333 Major depressive disorder, recurrent, severe with psychotic symptoms: Secondary | ICD-10-CM | POA: Diagnosis not present

## 2016-04-11 NOTE — Telephone Encounter (Signed)
Ok to try for [facility] and see if Einar Pheasant could addend physical for face to face?

## 2016-04-11 NOTE — Telephone Encounter (Signed)
Noted. Will gladly addend.

## 2016-04-11 NOTE — Telephone Encounter (Signed)
[place] for [facility] for rollator for her CP.  Holly Saunders- do you think it would be appropriate to append your visit with her to include mention of her CP and balance issues to have an official 'face to face' for this walker?

## 2016-04-11 NOTE — Telephone Encounter (Signed)
Called and spoke with pt mom and she will call us back to verify if the hard scripts will work or if we need to place a DME.

## 2016-04-11 NOTE — Telephone Encounter (Signed)
Mom says she wants to go with [facility] regarding the script for Rollater walker.  Thanks  -LL

## 2016-04-11 NOTE — Telephone Encounter (Signed)
DME placed, [facility] made aware. Waiting on response from [facility].

## 2016-04-14 DIAGNOSIS — F333 Major depressive disorder, recurrent, severe with psychotic symptoms: Secondary | ICD-10-CM | POA: Diagnosis not present

## 2016-04-15 DIAGNOSIS — F333 Major depressive disorder, recurrent, severe with psychotic symptoms: Secondary | ICD-10-CM | POA: Diagnosis not present

## 2016-04-16 DIAGNOSIS — F333 Major depressive disorder, recurrent, severe with psychotic symptoms: Secondary | ICD-10-CM | POA: Diagnosis not present

## 2016-04-17 DIAGNOSIS — F333 Major depressive disorder, recurrent, severe with psychotic symptoms: Secondary | ICD-10-CM | POA: Diagnosis not present

## 2016-04-18 DIAGNOSIS — F333 Major depressive disorder, recurrent, severe with psychotic symptoms: Secondary | ICD-10-CM | POA: Diagnosis not present

## 2016-04-18 DIAGNOSIS — G809 Cerebral palsy, unspecified: Secondary | ICD-10-CM | POA: Diagnosis not present

## 2016-04-21 DIAGNOSIS — F333 Major depressive disorder, recurrent, severe with psychotic symptoms: Secondary | ICD-10-CM | POA: Diagnosis not present

## 2016-04-22 DIAGNOSIS — F333 Major depressive disorder, recurrent, severe with psychotic symptoms: Secondary | ICD-10-CM | POA: Diagnosis not present

## 2016-04-23 DIAGNOSIS — F333 Major depressive disorder, recurrent, severe with psychotic symptoms: Secondary | ICD-10-CM | POA: Diagnosis not present

## 2016-04-24 DIAGNOSIS — F333 Major depressive disorder, recurrent, severe with psychotic symptoms: Secondary | ICD-10-CM | POA: Diagnosis not present

## 2016-04-25 DIAGNOSIS — F322 Major depressive disorder, single episode, severe without psychotic features: Secondary | ICD-10-CM | POA: Diagnosis not present

## 2016-04-28 DIAGNOSIS — F322 Major depressive disorder, single episode, severe without psychotic features: Secondary | ICD-10-CM | POA: Diagnosis not present

## 2016-04-30 DIAGNOSIS — F322 Major depressive disorder, single episode, severe without psychotic features: Secondary | ICD-10-CM | POA: Diagnosis not present

## 2016-05-02 DIAGNOSIS — F322 Major depressive disorder, single episode, severe without psychotic features: Secondary | ICD-10-CM | POA: Diagnosis not present

## 2016-05-05 DIAGNOSIS — F322 Major depressive disorder, single episode, severe without psychotic features: Secondary | ICD-10-CM | POA: Diagnosis not present

## 2016-05-07 DIAGNOSIS — F322 Major depressive disorder, single episode, severe without psychotic features: Secondary | ICD-10-CM | POA: Diagnosis not present

## 2016-05-09 DIAGNOSIS — F322 Major depressive disorder, single episode, severe without psychotic features: Secondary | ICD-10-CM | POA: Diagnosis not present

## 2016-05-15 DIAGNOSIS — F322 Major depressive disorder, single episode, severe without psychotic features: Secondary | ICD-10-CM | POA: Diagnosis not present

## 2016-05-16 DIAGNOSIS — F322 Major depressive disorder, single episode, severe without psychotic features: Secondary | ICD-10-CM | POA: Diagnosis not present

## 2016-05-16 DIAGNOSIS — F332 Major depressive disorder, recurrent severe without psychotic features: Secondary | ICD-10-CM | POA: Diagnosis not present

## 2016-05-19 DIAGNOSIS — F322 Major depressive disorder, single episode, severe without psychotic features: Secondary | ICD-10-CM | POA: Diagnosis not present

## 2016-05-20 DIAGNOSIS — F322 Major depressive disorder, single episode, severe without psychotic features: Secondary | ICD-10-CM | POA: Diagnosis not present

## 2016-05-23 DIAGNOSIS — F322 Major depressive disorder, single episode, severe without psychotic features: Secondary | ICD-10-CM | POA: Diagnosis not present

## 2016-05-26 DIAGNOSIS — F322 Major depressive disorder, single episode, severe without psychotic features: Secondary | ICD-10-CM | POA: Diagnosis not present

## 2016-05-28 DIAGNOSIS — F322 Major depressive disorder, single episode, severe without psychotic features: Secondary | ICD-10-CM | POA: Diagnosis not present

## 2016-06-02 DIAGNOSIS — F322 Major depressive disorder, single episode, severe without psychotic features: Secondary | ICD-10-CM | POA: Diagnosis not present

## 2016-06-04 DIAGNOSIS — F322 Major depressive disorder, single episode, severe without psychotic features: Secondary | ICD-10-CM | POA: Diagnosis not present

## 2016-06-06 DIAGNOSIS — F322 Major depressive disorder, single episode, severe without psychotic features: Secondary | ICD-10-CM | POA: Diagnosis not present

## 2016-06-09 DIAGNOSIS — F322 Major depressive disorder, single episode, severe without psychotic features: Secondary | ICD-10-CM | POA: Diagnosis not present

## 2016-06-11 DIAGNOSIS — F322 Major depressive disorder, single episode, severe without psychotic features: Secondary | ICD-10-CM | POA: Diagnosis not present

## 2016-06-13 DIAGNOSIS — F332 Major depressive disorder, recurrent severe without psychotic features: Secondary | ICD-10-CM | POA: Diagnosis not present

## 2016-06-16 DIAGNOSIS — F332 Major depressive disorder, recurrent severe without psychotic features: Secondary | ICD-10-CM | POA: Diagnosis not present

## 2016-06-18 DIAGNOSIS — F23 Brief psychotic disorder: Secondary | ICD-10-CM | POA: Diagnosis not present

## 2016-06-20 DIAGNOSIS — F23 Brief psychotic disorder: Secondary | ICD-10-CM | POA: Diagnosis not present

## 2016-06-25 DIAGNOSIS — F23 Brief psychotic disorder: Secondary | ICD-10-CM | POA: Diagnosis not present

## 2016-07-02 DIAGNOSIS — F23 Brief psychotic disorder: Secondary | ICD-10-CM | POA: Diagnosis not present

## 2016-07-16 DIAGNOSIS — F23 Brief psychotic disorder: Secondary | ICD-10-CM | POA: Diagnosis not present

## 2016-07-30 DIAGNOSIS — F23 Brief psychotic disorder: Secondary | ICD-10-CM | POA: Diagnosis not present

## 2016-09-30 ENCOUNTER — Other Ambulatory Visit: Payer: Self-pay | Admitting: Family Medicine

## 2016-09-30 DIAGNOSIS — Z9289 Personal history of other medical treatment: Secondary | ICD-10-CM

## 2016-10-08 ENCOUNTER — Ambulatory Visit (INDEPENDENT_AMBULATORY_CARE_PROVIDER_SITE_OTHER): Payer: Medicare HMO

## 2016-10-08 DIAGNOSIS — Z1231 Encounter for screening mammogram for malignant neoplasm of breast: Secondary | ICD-10-CM

## 2016-10-08 DIAGNOSIS — Z9289 Personal history of other medical treatment: Secondary | ICD-10-CM

## 2016-10-22 DIAGNOSIS — F23 Brief psychotic disorder: Secondary | ICD-10-CM | POA: Diagnosis not present

## 2017-01-16 DIAGNOSIS — F23 Brief psychotic disorder: Secondary | ICD-10-CM | POA: Diagnosis not present

## 2017-03-23 DIAGNOSIS — M7989 Other specified soft tissue disorders: Secondary | ICD-10-CM | POA: Diagnosis not present

## 2017-03-23 DIAGNOSIS — W108XXA Fall (on) (from) other stairs and steps, initial encounter: Secondary | ICD-10-CM | POA: Diagnosis not present

## 2017-03-23 DIAGNOSIS — S63045D Dislocation of carpometacarpal joint of left thumb, subsequent encounter: Secondary | ICD-10-CM | POA: Diagnosis not present

## 2017-03-23 DIAGNOSIS — S60519A Abrasion of unspecified hand, initial encounter: Secondary | ICD-10-CM | POA: Diagnosis not present

## 2017-03-23 DIAGNOSIS — M79641 Pain in right hand: Secondary | ICD-10-CM | POA: Diagnosis not present

## 2017-03-23 DIAGNOSIS — Z23 Encounter for immunization: Secondary | ICD-10-CM | POA: Diagnosis not present

## 2017-03-23 DIAGNOSIS — M791 Myalgia, unspecified site: Secondary | ICD-10-CM | POA: Diagnosis not present

## 2017-03-23 DIAGNOSIS — S63114A Dislocation of metacarpophalangeal joint of right thumb, initial encounter: Secondary | ICD-10-CM | POA: Diagnosis not present

## 2017-03-23 DIAGNOSIS — Y9301 Activity, walking, marching and hiking: Secondary | ICD-10-CM | POA: Diagnosis not present

## 2017-03-23 DIAGNOSIS — S52614A Nondisplaced fracture of right ulna styloid process, initial encounter for closed fracture: Secondary | ICD-10-CM | POA: Diagnosis not present

## 2017-03-23 DIAGNOSIS — Y999 Unspecified external cause status: Secondary | ICD-10-CM | POA: Diagnosis not present

## 2017-03-23 DIAGNOSIS — S63044D Dislocation of carpometacarpal joint of right thumb, subsequent encounter: Secondary | ICD-10-CM | POA: Diagnosis not present

## 2017-03-23 DIAGNOSIS — S63044A Dislocation of carpometacarpal joint of right thumb, initial encounter: Secondary | ICD-10-CM | POA: Diagnosis not present

## 2017-03-23 DIAGNOSIS — S63045A Dislocation of carpometacarpal joint of left thumb, initial encounter: Secondary | ICD-10-CM | POA: Diagnosis not present

## 2017-03-23 DIAGNOSIS — W19XXXA Unspecified fall, initial encounter: Secondary | ICD-10-CM | POA: Diagnosis not present

## 2017-03-23 DIAGNOSIS — S0081XA Abrasion of other part of head, initial encounter: Secondary | ICD-10-CM | POA: Diagnosis not present

## 2017-03-24 DIAGNOSIS — M2012 Hallux valgus (acquired), left foot: Secondary | ICD-10-CM | POA: Diagnosis not present

## 2017-03-24 DIAGNOSIS — M79672 Pain in left foot: Secondary | ICD-10-CM | POA: Diagnosis not present

## 2017-03-24 DIAGNOSIS — S92422A Displaced fracture of distal phalanx of left great toe, initial encounter for closed fracture: Secondary | ICD-10-CM | POA: Diagnosis not present

## 2017-03-24 DIAGNOSIS — S92401A Displaced unspecified fracture of right great toe, initial encounter for closed fracture: Secondary | ICD-10-CM | POA: Diagnosis not present

## 2017-03-24 DIAGNOSIS — M19072 Primary osteoarthritis, left ankle and foot: Secondary | ICD-10-CM | POA: Diagnosis not present

## 2017-03-24 DIAGNOSIS — S63114A Dislocation of metacarpophalangeal joint of right thumb, initial encounter: Secondary | ICD-10-CM | POA: Diagnosis not present

## 2017-04-10 DIAGNOSIS — F23 Brief psychotic disorder: Secondary | ICD-10-CM | POA: Diagnosis not present

## 2017-04-15 ENCOUNTER — Telehealth: Payer: Self-pay | Admitting: *Deleted

## 2017-04-15 NOTE — Telephone Encounter (Signed)
Information has been faxed as requested.  Copied from [facility] [place]. Topic: Inquiry >> Apr 14, 2017  4:21 PM Margot Ables wrote: Reason for CRM: pt mother states pt had flu shot at last OV and needs documention for [facility] for handicap people, fax# [phone]

## 2017-04-17 DIAGNOSIS — S63116A Dislocation of metacarpophalangeal joint of unspecified thumb, initial encounter: Secondary | ICD-10-CM | POA: Diagnosis not present

## 2017-04-17 DIAGNOSIS — S92401D Displaced unspecified fracture of right great toe, subsequent encounter for fracture with routine healing: Secondary | ICD-10-CM | POA: Diagnosis not present

## 2017-04-17 DIAGNOSIS — S63114D Dislocation of metacarpophalangeal joint of right thumb, subsequent encounter: Secondary | ICD-10-CM | POA: Diagnosis not present

## 2017-05-11 ENCOUNTER — Other Ambulatory Visit: Payer: Self-pay

## 2017-05-11 ENCOUNTER — Ambulatory Visit (INDEPENDENT_AMBULATORY_CARE_PROVIDER_SITE_OTHER): Payer: Medicare HMO | Admitting: Family Medicine

## 2017-05-11 ENCOUNTER — Encounter: Payer: Self-pay | Admitting: Family Medicine

## 2017-05-11 VITALS — BP 112/68 | HR 67 | Temp 98.9°F | Resp 16 | Ht 66.5 in | Wt 229.0 lb

## 2017-05-11 DIAGNOSIS — M25531 Pain in right wrist: Secondary | ICD-10-CM | POA: Diagnosis not present

## 2017-05-11 DIAGNOSIS — G809 Cerebral palsy, unspecified: Secondary | ICD-10-CM

## 2017-05-11 DIAGNOSIS — E669 Obesity, unspecified: Secondary | ICD-10-CM | POA: Insufficient documentation

## 2017-05-11 DIAGNOSIS — Z23 Encounter for immunization: Secondary | ICD-10-CM | POA: Diagnosis not present

## 2017-05-11 DIAGNOSIS — N3941 Urge incontinence: Secondary | ICD-10-CM

## 2017-05-11 DIAGNOSIS — F3341 Major depressive disorder, recurrent, in partial remission: Secondary | ICD-10-CM

## 2017-05-11 LAB — POCT URINALYSIS DIPSTICK
GLUCOSE UA: NEGATIVE
Ketones, UA: NEGATIVE
LEUKOCYTES UA: NEGATIVE
Nitrite, UA: NEGATIVE
PH UA: 6 (ref 5.0–8.0)
RBC UA: NEGATIVE
UROBILINOGEN UA: 0.2 U/dL

## 2017-05-11 MED ORDER — OXYBUTYNIN CHLORIDE 5 MG PO TABS: 5.0000 mg | ORAL_TABLET | Freq: Two times a day (BID) | ORAL | 3 refills | Status: DC

## 2017-05-11 NOTE — Progress Notes (Signed)
   Subjective:    Patient ID: Holly Saunders, female    DOB: [birth year], 47 y.o.   MRN: [id number]  HPI Urinary incontinence- pt has sensation of incomplete emptying.  Pt then feels urinary urgency and is unable to make it to the bathroom.  Pt has had this previously but sxs were improving.  Just recently they've worsened again.  No burning w/ urination.  R wrist pain- pt fell on January 21st down the front steps of parent's house.  Dislocated and fx'd R thumb.  Has f/u scheduled next week.  Ganglion cyst on wrist is now gone- pt wants to know what happened to it.  Depression- chronic problem, on Lexapro and Prozac w/ Hydroxyzine and Risperdal.  Seeing Psychiatrist in [place].  Just completed counseling.  This was after stay at [facility] and IOP.     Review of Systems For ROS see HPI     Objective:   Physical Exam  Constitutional: She is oriented to person, place, and time. She appears well-developed and well-nourished. No distress.  obese  HENT:  Head: Normocephalic and atraumatic.  Neck: Normal range of motion. Neck supple. No thyromegaly present.  Cardiovascular: Normal rate, regular rhythm, normal heart sounds and intact distal pulses.  Pulmonary/Chest: Effort normal and breath sounds normal. No respiratory distress. She has no wheezes. She has no rales.  Abdominal: Soft. Bowel sounds are normal. She exhibits no distension. There is no tenderness. There is no rebound.  Musculoskeletal:  R wrist in ortho brace, L foot in post-op shoe  Lymphadenopathy:    She has no cervical adenopathy.  Neurological: She is alert and oriented to person, place, and time.  Skin: Skin is warm and dry.  Psychiatric: She has a normal mood and affect. Her behavior is normal. Thought content normal.          Assessment & Plan:

## 2017-05-11 NOTE — Patient Instructions (Signed)
Schedule your complete physical in 6 months START the Ditropan twice daily to help with the bladder Your urine is normal except it shows dehydration- please increase your water intake Your ganglion cyst likely popped when you fell- this is ok! Once the orthopedic doctor gives you the ok, try and get regular exercise Call with any questions or concerns Happy Early Birthday!!!

## 2017-05-12 NOTE — Assessment & Plan Note (Signed)
Unchanged.  Pt has upcoming appt w/ Ortho regarding possible Botox.

## 2017-05-12 NOTE — Assessment & Plan Note (Signed)
Deteriorated.  Pt continues to gain weight.  Reviewed that her Risperdal can cause weight gain- pt was unaware of this.  She will address this with psych at her upcoming appt.  Stressed need for healthy diet and exercise once she is cleared by Ortho.  Will follow.

## 2017-05-12 NOTE — Assessment & Plan Note (Signed)
Chronic problem.  Pt now following w/ psychiatry after having a 'break down' and an inpt stay.  She reports sxs are well controlled and that psych is considering tapering her meds.  Will follow.

## 2017-05-12 NOTE — Assessment & Plan Note (Signed)
Ongoing issue for pt.  No evidence of UTI today.  Will start low dose Ditropan to help sxs but must be mindful of side effects.  Will follow closely and if no improvement, will refer back to urology.  Pt expressed understanding and is in agreement w/ plan.

## 2017-05-15 DIAGNOSIS — S63114D Dislocation of metacarpophalangeal joint of right thumb, subsequent encounter: Secondary | ICD-10-CM | POA: Diagnosis not present

## 2017-07-03 DIAGNOSIS — F23 Brief psychotic disorder: Secondary | ICD-10-CM | POA: Diagnosis not present

## 2017-07-29 DIAGNOSIS — M2142 Flat foot [pes planus] (acquired), left foot: Secondary | ICD-10-CM | POA: Diagnosis not present

## 2017-07-29 DIAGNOSIS — G801 Spastic diplegic cerebral palsy: Secondary | ICD-10-CM | POA: Diagnosis not present

## 2017-07-29 DIAGNOSIS — M25572 Pain in left ankle and joints of left foot: Secondary | ICD-10-CM | POA: Diagnosis not present

## 2017-07-29 DIAGNOSIS — M216X2 Other acquired deformities of left foot: Secondary | ICD-10-CM | POA: Diagnosis not present

## 2017-07-29 DIAGNOSIS — M19072 Primary osteoarthritis, left ankle and foot: Secondary | ICD-10-CM | POA: Diagnosis not present

## 2017-07-29 DIAGNOSIS — M160 Bilateral primary osteoarthritis of hip: Secondary | ICD-10-CM | POA: Diagnosis not present

## 2017-07-29 DIAGNOSIS — M2012 Hallux valgus (acquired), left foot: Secondary | ICD-10-CM | POA: Diagnosis not present

## 2017-07-29 DIAGNOSIS — Q6621 Congenital metatarsus primus varus: Secondary | ICD-10-CM | POA: Diagnosis not present

## 2017-08-24 DIAGNOSIS — Z7409 Other reduced mobility: Secondary | ICD-10-CM | POA: Diagnosis not present

## 2017-08-24 DIAGNOSIS — G801 Spastic diplegic cerebral palsy: Secondary | ICD-10-CM | POA: Diagnosis not present

## 2017-09-04 DIAGNOSIS — Z7409 Other reduced mobility: Secondary | ICD-10-CM | POA: Diagnosis not present

## 2017-09-04 DIAGNOSIS — G801 Spastic diplegic cerebral palsy: Secondary | ICD-10-CM | POA: Diagnosis not present

## 2017-09-17 DIAGNOSIS — M19072 Primary osteoarthritis, left ankle and foot: Secondary | ICD-10-CM | POA: Diagnosis not present

## 2017-09-17 DIAGNOSIS — G801 Spastic diplegic cerebral palsy: Secondary | ICD-10-CM | POA: Diagnosis not present

## 2017-09-18 DIAGNOSIS — Z7409 Other reduced mobility: Secondary | ICD-10-CM | POA: Diagnosis not present

## 2017-09-18 DIAGNOSIS — G801 Spastic diplegic cerebral palsy: Secondary | ICD-10-CM | POA: Diagnosis not present

## 2017-09-21 DIAGNOSIS — G801 Spastic diplegic cerebral palsy: Secondary | ICD-10-CM | POA: Diagnosis not present

## 2017-09-21 DIAGNOSIS — Z7409 Other reduced mobility: Secondary | ICD-10-CM | POA: Diagnosis not present

## 2017-09-21 DIAGNOSIS — F23 Brief psychotic disorder: Secondary | ICD-10-CM | POA: Diagnosis not present

## 2017-09-28 DIAGNOSIS — Z7409 Other reduced mobility: Secondary | ICD-10-CM | POA: Diagnosis not present

## 2017-09-28 DIAGNOSIS — G801 Spastic diplegic cerebral palsy: Secondary | ICD-10-CM | POA: Diagnosis not present

## 2017-10-02 DIAGNOSIS — Z7409 Other reduced mobility: Secondary | ICD-10-CM | POA: Diagnosis not present

## 2017-10-02 DIAGNOSIS — G801 Spastic diplegic cerebral palsy: Secondary | ICD-10-CM | POA: Diagnosis not present

## 2017-10-06 ENCOUNTER — Other Ambulatory Visit: Payer: Self-pay | Admitting: Family Medicine

## 2017-10-16 DIAGNOSIS — Z7409 Other reduced mobility: Secondary | ICD-10-CM | POA: Diagnosis not present

## 2017-10-16 DIAGNOSIS — G801 Spastic diplegic cerebral palsy: Secondary | ICD-10-CM | POA: Diagnosis not present

## 2017-10-26 DIAGNOSIS — Z7409 Other reduced mobility: Secondary | ICD-10-CM | POA: Diagnosis not present

## 2017-10-26 DIAGNOSIS — G801 Spastic diplegic cerebral palsy: Secondary | ICD-10-CM | POA: Diagnosis not present

## 2017-11-04 ENCOUNTER — Ambulatory Visit (INDEPENDENT_AMBULATORY_CARE_PROVIDER_SITE_OTHER): Payer: Medicare HMO | Admitting: Family Medicine

## 2017-11-04 ENCOUNTER — Encounter: Payer: Self-pay | Admitting: General Practice

## 2017-11-04 ENCOUNTER — Encounter: Payer: Self-pay | Admitting: Family Medicine

## 2017-11-04 ENCOUNTER — Other Ambulatory Visit: Payer: Self-pay

## 2017-11-04 VITALS — BP 113/79 | HR 63 | Temp 97.9°F | Resp 16 | Ht 67.0 in | Wt 238.0 lb

## 2017-11-04 DIAGNOSIS — Z23 Encounter for immunization: Secondary | ICD-10-CM | POA: Diagnosis not present

## 2017-11-04 DIAGNOSIS — E669 Obesity, unspecified: Secondary | ICD-10-CM | POA: Diagnosis not present

## 2017-11-04 DIAGNOSIS — Z0000 Encounter for general adult medical examination without abnormal findings: Secondary | ICD-10-CM

## 2017-11-04 LAB — BASIC METABOLIC PANEL
BUN: 17 mg/dL (ref 6–23)
CHLORIDE: 105 meq/L (ref 96–112)
CO2: 29 mEq/L (ref 19–32)
Calcium: 9 mg/dL (ref 8.4–10.5)
Creatinine, Ser: 1.02 mg/dL (ref 0.40–1.20)
GFR: 61.36 mL/min (ref >60.00)
Glucose, Bld: 98 mg/dL (ref 70–99)
POTASSIUM: 4.1 meq/L (ref 3.5–5.1)
Sodium: 140 mEq/L (ref 135–145)

## 2017-11-04 LAB — CBC WITH DIFFERENTIAL/PLATELET
Basophils Absolute: 0.1 10*3/uL (ref 0.0–0.1)
Basophils Relative: 0.9 % (ref 0.0–3.0)
EOS PCT: 3 % (ref 0.0–5.0)
Eosinophils Absolute: 0.2 10*3/uL (ref 0.0–0.7)
HCT: 42.8 % (ref 36.0–46.0)
Hemoglobin: 14.6 g/dL (ref 12.0–15.0)
Lymphocytes Relative: 26.8 % (ref 12.0–46.0)
Lymphs Abs: 1.7 10*3/uL (ref 0.7–4.0)
MCHC: 34.1 g/dL (ref 30.0–36.0)
MCV: 81.7 fl (ref 78.0–100.0)
MONOS PCT: 5.4 % (ref 3.0–12.0)
Monocytes Absolute: 0.3 10*3/uL (ref 0.1–1.0)
NEUTROS ABS: 4 10*3/uL (ref 1.4–7.7)
NEUTROS PCT: 63.9 % (ref 43.0–77.0)
PLATELETS: 239 10*3/uL (ref 150.0–400.0)
RBC: 5.24 Mil/uL — ABNORMAL HIGH (ref 3.87–5.11)
RDW: 12.9 % (ref 11.5–15.5)
WBC: 6.3 10*3/uL (ref 4.0–10.5)

## 2017-11-04 LAB — LIPID PANEL
CHOL/HDL RATIO: 4
Cholesterol: 190 mg/dL (ref 0–200)
HDL: 43.1 mg/dL (ref >39.00)
LDL CALC: 111 mg/dL — AB (ref 0–99)
NONHDL: 147.21
Triglycerides: 183 mg/dL — ABNORMAL HIGH (ref 0.0–149.0)
VLDL: 36.6 mg/dL (ref 0.0–40.0)

## 2017-11-04 LAB — HEPATIC FUNCTION PANEL
ALK PHOS: 54 U/L (ref 39–117)
ALT: 12 U/L (ref 0–35)
AST: 13 U/L (ref 0–37)
Albumin: 4.1 g/dL (ref 3.5–5.2)
BILIRUBIN DIRECT: 0.1 mg/dL (ref 0.0–0.3)
Total Bilirubin: 0.5 mg/dL (ref 0.2–1.2)
Total Protein: 6.5 g/dL (ref 6.0–8.3)

## 2017-11-04 LAB — TSH: TSH: 4.28 u[IU]/mL (ref 0.35–4.50)

## 2017-11-04 NOTE — Patient Instructions (Signed)
Schedule your Wellness Visit w/ Kim at your convenience Follow up with me in 6 months to recheck weight loss progress We'll notify you of your lab results and make any changes if needed Continue to work on healthy diet and regular exercise- you can do it! SCHEDULE your mammogram!! Call with any questions or concerns ENJOY YOUR TRIP!!!

## 2017-11-04 NOTE — Progress Notes (Signed)
   Subjective:    Patient ID: Holly Saunders, female    DOB: 18-Aug-1970, 47 y.o.   MRN: [id number]  HPI CPE- no need for pap (hysterectomy), due for mammo (mom plans to schedule).  Will get flu today.  Pt has gained 10 lbs since last visit- not exercising regularly but plans to start a diet.   Review of Systems Patient reports no vision/ hearing changes, adenopathy,fever, weight change,  persistant/recurrent hoarseness , swallowing issues, chest pain, palpitations, edema, persistant/recurrent cough, hemoptysis, dyspnea (rest/exertional/paroxysmal nocturnal), gastrointestinal bleeding (melena, rectal bleeding), abdominal pain, significant heartburn, bowel changes, Gyn symptoms (abnormal  bleeding, pain),  syncope, memory loss, numbness & tingling, skin/hair/nail changes, abnormal bruising or bleeding, anxiety, or depression.  + stress incontinence    Objective:   Physical Exam General Appearance:    Alert, cooperative, no distress, appears stated age, obese  Head:    Normocephalic, without obvious abnormality, atraumatic  Eyes:    PERRL, conjunctiva/corneas clear, EOM's intact, fundi    benign, both eyes  Ears:    Normal TM's and external ear canals, both ears  Nose:   Nares normal, septum midline, mucosa normal, no drainage    or sinus tenderness  Throat:   Lips, mucosa, and tongue normal; teeth and gums normal  Neck:   Supple, symmetrical, trachea midline, no adenopathy;    Thyroid: no enlargement/tenderness/nodules  Back:     Symmetric, no curvature, ROM normal, no CVA tenderness  Lungs:     Clear to auscultation bilaterally, respirations unlabored  Chest Wall:    No tenderness or deformity   Heart:    Regular rate and rhythm, S1 and S2 normal, no murmur, rub   or gallop  Breast Exam:    Deferred to GYN  Abdomen:     Soft, non-tender, bowel sounds active all four quadrants,    no masses, no organomegaly  Genitalia:    Deferred to GYN  Rectal:    Extremities:   Extremities normal,  atraumatic, no cyanosis or edema  Pulses:   2+ and symmetric all extremities  Skin:   Skin color, texture, turgor normal, no rashes or lesions  Lymph nodes:   Cervical, supraclavicular, and axillary nodes normal  Neurologic:   CNII-XII intact, normal strength, sensation and reflexes    throughout          Assessment & Plan:

## 2017-11-04 NOTE — Assessment & Plan Note (Signed)
Pt's PE WNL w/ exception of obesity.  Due for mammo- mom to schedule.  Flu shot given.  Check labs.  Anticipatory guidance provided.

## 2017-11-04 NOTE — Assessment & Plan Note (Signed)
Deteriorated.  Pt has gained 10 lbs since last visit.  Check labs to risk stratify.  Encouraged healthy diet and regular exercise.  Will follow closely.

## 2017-11-18 ENCOUNTER — Other Ambulatory Visit: Payer: Self-pay | Admitting: Family Medicine

## 2017-11-18 DIAGNOSIS — Z1231 Encounter for screening mammogram for malignant neoplasm of breast: Secondary | ICD-10-CM

## 2017-12-09 ENCOUNTER — Ambulatory Visit (INDEPENDENT_AMBULATORY_CARE_PROVIDER_SITE_OTHER): Payer: Medicare HMO

## 2017-12-09 DIAGNOSIS — Z1231 Encounter for screening mammogram for malignant neoplasm of breast: Secondary | ICD-10-CM | POA: Diagnosis not present

## 2018-01-14 DIAGNOSIS — F23 Brief psychotic disorder: Secondary | ICD-10-CM | POA: Diagnosis not present

## 2018-02-13 ENCOUNTER — Other Ambulatory Visit: Payer: Self-pay | Admitting: Family Medicine

## 2018-04-05 ENCOUNTER — Encounter: Payer: Self-pay | Admitting: Physician Assistant

## 2018-04-05 ENCOUNTER — Other Ambulatory Visit: Payer: Self-pay

## 2018-04-05 ENCOUNTER — Ambulatory Visit (INDEPENDENT_AMBULATORY_CARE_PROVIDER_SITE_OTHER): Payer: PPO | Admitting: Physician Assistant

## 2018-04-05 VITALS — BP 138/86 | HR 62 | Temp 98.5°F | Resp 16 | Ht 67.0 in | Wt 211.0 lb

## 2018-04-05 DIAGNOSIS — J208 Acute bronchitis due to other specified organisms: Secondary | ICD-10-CM | POA: Diagnosis not present

## 2018-04-05 MED ORDER — BENZONATATE 100 MG PO CAPS: 100.0000 mg | ORAL_CAPSULE | Freq: Three times a day (TID) | ORAL | 0 refills | Status: DC | PRN

## 2018-04-05 NOTE — Progress Notes (Signed)
Acute Office Visit  Subjective:    Patient ID: Holly Saunders, female    DOB: [birth year], 48 y.o.   MRN: [id number]  Chief Complaint  Patient presents with  . URI    HPI Patient is in today for cough that started 1 week ago. Endorses some Post nasal drainage, nasal congestion, right ear pressure, fatigue, headache to left temple when illness started but has resolved. Pt denies tooth pain, sore throat, diarrhea, nausea, vomiting, dizziness, fever, chills, palpitations, Endorses sick contacts, including mother.   Past Medical History:  Diagnosis Date  . Allergy   . Anxiety   . Cardiac murmur   . Cerebral palsy ([place])   . Cerebral palsy ([place])   . Colon polyps   . Ear infection    recurrent  . Headache(784.0)    migraines  . Hyperlipidemia   . Hypertension    not being treated for it at present time  . PONV (postoperative nausea and vomiting)   . Seasonal allergies     Past Surgical History:  Procedure Laterality Date  . ABDOMINAL HYSTERECTOMY    . CHOLECYSTECTOMY N/A 05/03/2013   Procedure: LAPAROSCOPIC CHOLECYSTECTOMY WITH INTRAOPERATIVE CHOLANGIOGRAM;  Surgeon: Joyice Faster. Cornett, MD;  Location: [facility];  Service: General;  Laterality: N/A;  . EYE SURGERY Bilateral    for cross eye  . heel cord surgery Bilateral   . HIP SURGERY     bialteral  . INCISION AND DRAINAGE ABSCESS Right    axilla  . LAPAROSCOPIC SUPRACERVICAL HYSTERECTOMY  01/31/09  . TONSILLECTOMY      Family History  Problem Relation Age of Onset  . Hypertension Mother   . Hyperlipidemia Mother   . Colon polyps Mother   . Irritable bowel syndrome Mother   . Ulcerative colitis Father   . Colon cancer Father        not sure, he has a colostomy  . Esophageal cancer Neg Hx   . Rectal cancer Neg Hx   . Stomach cancer Neg Hx     Social History   Socioeconomic History  . Marital status: Divorced    Spouse name: Not on file  . Number of children: Not on file  . Years of education: Not on file  .  Highest education level: Not on file  Occupational History  . Not on file  Social Needs  . Financial resource strain: Not on file  . Food insecurity:    Worry: Not on file    Inability: Not on file  . Transportation needs:    Medical: Not on file    Non-medical: Not on file  Tobacco Use  . Smoking status: Never Smoker  . Smokeless tobacco: Never Used  Substance and Sexual Activity  . Alcohol use: Yes    Comment: occasionally  . Drug use: No  . Sexual activity: Never  Lifestyle  . Physical activity:    Days per week: Not on file    Minutes per session: Not on file  . Stress: Not on file  Relationships  . Social connections:    Talks on phone: Not on file    Gets together: Not on file    Attends religious service: Not on file    Active member of club or organization: Not on file    Attends meetings of clubs or organizations: Not on file    Relationship status: Not on file  . Intimate partner violence:    Fear of current or ex partner: Not on  file    Emotionally abused: Not on file    Physically abused: Not on file    Forced sexual activity: Not on file  Other Topics Concern  . Not on file  Social History Narrative  . Not on file    Outpatient Medications Prior to Visit  Medication Sig Dispense Refill  . acetaminophen (TYLENOL) 325 MG tablet Take 650 mg by mouth as needed.    Marland Kitchen FLUoxetine (PROZAC) 20 MG tablet Take 20 mg by mouth daily.    . hydrOXYzine (VISTARIL) 25 MG capsule Take 25 mg by mouth every 6 (six) hours as needed.    Marland Kitchen oxybutynin (DITROPAN) 5 MG tablet TAKE 1 TABLET BY MOUTH TWICE DAILY 60 tablet 6  . risperiDONE (RISPERDAL) 0.25 MG tablet Take 0.25 mg by mouth at bedtime.    . risperiDONE (RISPERDAL) 0.5 MG tablet Take 0.5 mg by mouth at bedtime.  2   No facility-administered medications prior to visit.     Allergies  Allergen Reactions  . Codeine     constipation    ROS Pertinent ROS are listed in the HPI.    Objective:    Physical Exam    Constitutional: She is oriented to person, place, and time. She appears well-developed and well-nourished.  HENT:  Right Ear: Hearing, external ear and ear canal normal.  Left Ear: Hearing, external ear and ear canal normal. Tympanic membrane is bulging.  Mouth/Throat: Uvula is midline.  Cardiovascular: Normal rate, regular rhythm and normal heart sounds.  Pulmonary/Chest: Effort normal and breath sounds normal.  Neurological: She is alert and oriented to person, place, and time.    BP 138/86   Pulse 62   Temp 98.5 F (36.9 C) (Oral)   Resp 16   Ht 5\' 7"  (1.702 m)   Wt 95.7 kg   SpO2 98%   BMI 33.05 kg/m  Wt Readings from Last 3 Encounters:  04/05/18 95.7 kg  11/04/17 108 kg  05/11/17 103.9 kg    There are no preventive care reminders to display for this patient.  There are no preventive care reminders to display for this patient.   Lab Results  Component Value Date   TSH 4.28 11/04/2017   Lab Results  Component Value Date   WBC 6.3 11/04/2017   HGB 14.6 11/04/2017   HCT 42.8 11/04/2017   MCV 81.7 11/04/2017   PLT 239.0 11/04/2017   Lab Results  Component Value Date   NA 140 11/04/2017   K 4.1 11/04/2017   CO2 29 11/04/2017   GLUCOSE 98 11/04/2017   BUN 17 11/04/2017   CREATININE 1.02 11/04/2017   BILITOT 0.5 11/04/2017   ALKPHOS 54 11/04/2017   AST 13 11/04/2017   ALT 12 11/04/2017   PROT 6.5 11/04/2017   ALBUMIN 4.1 11/04/2017   CALCIUM 9.0 11/04/2017   GFR 61.36 11/04/2017   Lab Results  Component Value Date   CHOL 190 11/04/2017   Lab Results  Component Value Date   HDL 43.10 11/04/2017   Lab Results  Component Value Date   LDLCALC 111 (H) 11/04/2017   Lab Results  Component Value Date   TRIG 183.0 (H) 11/04/2017   Lab Results  Component Value Date   CHOLHDL 4 11/04/2017   Lab Results  Component Value Date   HGBA1C 4.9 03/25/2016       Assessment & Plan:   1. Viral bronchitis Lungs CTAB. Symptoms are mild and she has  multiple sick contacts, including mom. Discussed viral  etiology. Reviewed supportive measures and OTC medications with patient and mother. Rx Tessalon. Return precautions reviewed. - benzonatate (TESSALON) 100 MG capsule; Take 1 capsule (100 mg total) by mouth 3 (three) times daily as needed for cough.  Dispense: 30 capsule; Refill: 0   No orders of the defined types were placed in this encounter.    Erin E Mecum, Student-PA

## 2018-04-05 NOTE — Patient Instructions (Signed)
We are sorry that you are not feeling well.  Here is how we plan to help!  Based on your presentation I believe you most likely have A cough due to a virus.  This is called viral bronchitis and is best treated by rest, plenty of fluids and control of the cough.  You may use Ibuprofen or Tylenol as directed to help your symptoms.  Also please start over-the-counter Theraflu.   In addition you may use A prescription cough medication called Tessalon Perles 100mg . You may take 1-2 capsules every 8 hours as needed for your cough. I have sent in a prescription for this.   HOME CARE . Only take medications as instructed by your medical team. . Complete the entire course of an antibiotic. . Drink plenty of fluids and get plenty of rest. . Avoid close contacts especially the very young and the elderly . Cover your mouth if you cough or cough into your sleeve. . Always remember to wash your hands . A steam or ultrasonic humidifier can help congestion.   GET HELP RIGHT AWAY IF: . You develop worsening fever. . You become short of breath . You cough up blood. . Your symptoms persist after you have completed your treatment plan MAKE SURE YOU   Understand these instructions.  Will watch your condition.  Will get help right away if you are not doing well or get worse.

## 2018-04-07 DIAGNOSIS — F23 Brief psychotic disorder: Secondary | ICD-10-CM | POA: Diagnosis not present

## 2018-05-04 NOTE — Progress Notes (Addendum)
Subjective:   Holly Saunders is a 48 y.o. female who presents for Medicare Annual (Subsequent) preventive examination.  Review of Systems:  No ROS.  Medicare Wellness Visit. Additional risk factors are reflected in the social history.  Cardiac Risk Factors include: sedentary lifestyle;obesity (BMI >30kg/m2)   Sleep patterns: Sleeps 8 hours.  Home Safety/Smoke Alarms: Feels safe in home. Smoke alarms in place.  Living environment; residence and Firearm Safety: Lives with parents in 1 story home. Rails in place at doors.  Seat Belt Safety/Bike Helmet: Wears seat belt.   Female:   Pap-N/A       Mammo-12/09/2017, BI-RADS CATEGORY  1: Negative.       Dexa scan-N/A        [facility]-Colonoscopy 02/02/2014, polyps. Recall 5 years. Holly Saunders)     Objective:     Vitals: BP 130/72 (BP Location: Left Arm, Patient Position: Sitting, Cuff Size: Normal)   Pulse 67   Temp 98.3 F (36.8 C) (Temporal)   Resp 16   Ht 5\' 7"  (1.702 m)   Wt 203 lb 2 oz (92.1 kg)   SpO2 98%   BMI 31.81 kg/m   Body mass index is 31.81 kg/m.  Advanced Directives 05/05/2018 04/27/2013  Does Patient Have a Medical Advance Directive? No Patient does not have advance directive;Patient would like information  Would patient like information on creating a medical advance directive? Yes (MAU/Ambulatory/Procedural Areas - Information given) Advance directive packet given    Tobacco Social History   Tobacco Use  Smoking Status Never Smoker  Smokeless Tobacco Never Used     Counseling given: Not Answered     Past Medical History:  Diagnosis Date  . Allergy   . Anxiety   . Cardiac murmur   . Cerebral palsy ([place])   . Cerebral palsy ([place])   . Colon polyps   . Ear infection    recurrent  . Headache(784.0)    migraines  . Hyperlipidemia   . Hypertension    not being treated for it at present time  . PONV (postoperative nausea and vomiting)   . Seasonal allergies    Past Surgical History:  Procedure Laterality  Date  . ABDOMINAL HYSTERECTOMY    . CHOLECYSTECTOMY N/A 05/03/2013   Procedure: LAPAROSCOPIC CHOLECYSTECTOMY WITH INTRAOPERATIVE CHOLANGIOGRAM;  Surgeon: Joyice Faster. Cornett, MD;  Location: [facility];  Service: General;  Laterality: N/A;  . EYE SURGERY Bilateral    for cross eye  . heel cord surgery Bilateral   . HIP SURGERY     bialteral  . INCISION AND DRAINAGE ABSCESS Right    axilla  . LAPAROSCOPIC SUPRACERVICAL HYSTERECTOMY  01/31/09  . TONSILLECTOMY     Family History  Problem Relation Age of Onset  . Hypertension Mother   . Hyperlipidemia Mother   . Colon polyps Mother   . Irritable bowel syndrome Mother   . Ulcerative colitis Father   . Colon cancer Father        not sure, he has a colostomy  . Cancer Father        blood  . Esophageal cancer Neg Hx   . Rectal cancer Neg Hx   . Stomach cancer Neg Hx    Social History   Socioeconomic History  . Marital status: Divorced    Spouse name: Not on file  . Number of children: Not on file  . Years of education: Not on file  . Highest education level: Not on file  Occupational History  . Not  on file  Social Needs  . Financial resource strain: Not on file  . Food insecurity:    Worry: Not on file    Inability: Not on file  . Transportation needs:    Medical: Not on file    Non-medical: Not on file  Tobacco Use  . Smoking status: Never Smoker  . Smokeless tobacco: Never Used  Substance and Sexual Activity  . Alcohol use: Yes    Comment: occasionally  . Drug use: No  . Sexual activity: Never  Lifestyle  . Physical activity:    Days per week: Not on file    Minutes per session: Not on file  . Stress: Not on file  Relationships  . Social connections:    Talks on phone: Not on file    Gets together: Not on file    Attends religious service: Not on file    Active member of club or organization: Not on file    Attends meetings of clubs or organizations: Not on file    Relationship status: Not on file  Other Topics  Concern  . Not on file  Social History Narrative  . Not on file    Outpatient Encounter Medications as of 05/05/2018  Medication Sig  . acetaminophen (TYLENOL) 325 MG tablet Take 650 mg by mouth as needed.  Marland Kitchen FLUoxetine (PROZAC) 20 MG tablet Take 20 mg by mouth daily.  Marland Kitchen oxybutynin (DITROPAN) 5 MG tablet TAKE 1 TABLET BY MOUTH TWICE DAILY  . hydrOXYzine (VISTARIL) 25 MG capsule Take 25 mg by mouth every 6 (six) hours as needed.  . risperiDONE (RISPERDAL) 0.25 MG tablet Take 0.25 mg by mouth at bedtime.  . [DISCONTINUED] benzonatate (TESSALON) 100 MG capsule Take 1 capsule (100 mg total) by mouth 3 (three) times daily as needed for cough.   No facility-administered encounter medications on file as of 05/05/2018.     Activities of Daily Living In your present state of health, do you have any difficulty performing the following activities: 05/05/2018 11/04/2017  Hearing? N N  Vision? N N  Difficulty concentrating or making decisions? N N  Walking or climbing stairs? N N  Dressing or bathing? N N  Doing errands, shopping? Y N  Comment mother drives/accompanies pt -  Conservation officer, nature and eating ? N -  Using the Toilet? N -  In the past six months, have you accidently leaked urine? Y -  Do you have problems with loss of bowel control? N -  Managing your Medications? N -  Managing your Finances? N -  Housekeeping or managing your Housekeeping? N -  Some recent data might be hidden    Patient Care Team: Holly Minium, MD as PCP - General (Family Medicine) Holly Shipper, MD as Consulting Physician (Gastroenterology)    Assessment:   This is a routine wellness examination for Holly Saunders.  Exercise Activities and Dietary recommendations Current Exercise Habits: The patient does not participate in regular exercise at present, Exercise limited by: orthopedic condition(s)   Diet (meal preparation, eat out, water intake, caffeinated beverages, dairy products, fruits and vegetables): Drinks  water.   Nutrisystem food/diet since November 2019.   Goals    . Weight (lb) < 195 lb (88.5 kg)     Lose weight by continuing Nutrisystem.        Fall Risk Fall Risk  05/05/2018 11/04/2017 05/11/2017 03/25/2016 12/19/2013  Falls in the past year? 1 No Yes No No  Comment Balance/gait issue (CP) - - - -  Number falls in past yr: 1 - - - -  Injury with Fall? 1 - Yes - -  Risk for fall due to : Impaired mobility;Impaired balance/gait;History of fall(s) - - Impaired mobility -  Follow up Falls prevention discussed - - - -    Depression Screen PHQ 2/9 Scores 05/05/2018 11/04/2017 05/11/2017 03/25/2016  PHQ - 2 Score 0 0 0 2  PHQ- 9 Score 0 0 0 8     Cognitive Function MMSE - Mini Mental State Exam 05/05/2018  Orientation to time 5  Orientation to Place 5  Registration 3  Attention/ Calculation 5  Recall 2  Language- name 2 objects 2  Language- repeat 1  Language- follow 3 step command 3  Language- read & follow direction 1  Write a sentence 1  Copy design 1  Total score 29          Immunization History  Administered Date(s) Administered  . Influenza Whole 12/03/2007, 12/01/2008  . Influenza,inj,Quad PF,6+ Mos 12/19/2013, 03/25/2016, 05/11/2017, 11/04/2017  . Td 10/27/2012  . Tdap 03/23/2017    Screening Tests Health Maintenance  Topic Date Due  . HIV Screening  05/12/2018 (Originally [birth year]/1984)  . COLONOSCOPY  02/03/2019  . TETANUS/TDAP  03/24/2027  . INFLUENZA VACCINE  Completed        Plan:     See Dr. Henrene Saunders in December.   Continue doing brain stimulating activities (puzzles, reading, adult coloring books, staying active) to keep memory sharp.   Bring a copy of your living will and/or healthcare power of attorney to your next office visit.  I have personally reviewed and noted the following in the patient's chart:   . Medical and social history . Use of alcohol, tobacco or illicit drugs  . Current medications and supplements . Functional ability and  status . Nutritional status . Physical activity . Advanced directives . List of other physicians . Hospitalizations, surgeries, and ER visits in previous 12 months . Vitals . Screenings to include cognitive, depression, and falls . Referrals and appointments  In addition, I have reviewed and discussed with patient certain preventive protocols, quality metrics, and best practice recommendations. A written personalized care plan for preventive services as well as general preventive health recommendations were provided to patient.     Gerilyn Nestle, RN  05/05/2018  Reviewed documentation provided by RN and agree w/ above.  Annye Asa, MD

## 2018-05-05 ENCOUNTER — Encounter: Payer: Self-pay | Admitting: Family Medicine

## 2018-05-05 ENCOUNTER — Other Ambulatory Visit: Payer: Self-pay

## 2018-05-05 ENCOUNTER — Ambulatory Visit (INDEPENDENT_AMBULATORY_CARE_PROVIDER_SITE_OTHER): Payer: PPO

## 2018-05-05 ENCOUNTER — Ambulatory Visit (INDEPENDENT_AMBULATORY_CARE_PROVIDER_SITE_OTHER): Payer: PPO | Admitting: Family Medicine

## 2018-05-05 VITALS — BP 130/72 | HR 67 | Temp 98.3°F | Resp 16 | Ht 67.0 in | Wt 203.0 lb

## 2018-05-05 VITALS — BP 130/72 | HR 67 | Temp 98.3°F | Resp 16 | Ht 67.0 in | Wt 203.1 lb

## 2018-05-05 DIAGNOSIS — Z0000 Encounter for general adult medical examination without abnormal findings: Secondary | ICD-10-CM | POA: Diagnosis not present

## 2018-05-05 DIAGNOSIS — E669 Obesity, unspecified: Secondary | ICD-10-CM

## 2018-05-05 NOTE — Progress Notes (Signed)
   Subjective:    Patient ID: Holly Saunders, female    DOB: [birth year], 48 y.o.   MRN: [id number]  HPI Obesity- pt weighed 238 in September.  In Feb was 211.  Down to 203 today for a loss of 35 lbs!  BMI is now 31.79  Pt is doing Nutrisystem since November.  Pt reports feeling good.  No regular exercise at this time.  No CP, SOB, HAs, abd pain, N/V, edema.   Review of Systems For ROS see HPI     Objective:   Physical Exam Vitals signs reviewed.  Constitutional:      General: She is not in acute distress.    Appearance: She is well-developed.  HENT:     Head: Normocephalic and atraumatic.  Eyes:     Conjunctiva/sclera: Conjunctivae normal.     Pupils: Pupils are equal, round, and reactive to light.  Neck:     Musculoskeletal: Normal range of motion and neck supple.     Thyroid: No thyromegaly.  Cardiovascular:     Rate and Rhythm: Normal rate and regular rhythm.     Heart sounds: Normal heart sounds. No murmur.  Pulmonary:     Effort: Pulmonary effort is normal. No respiratory distress.     Breath sounds: Normal breath sounds.  Abdominal:     General: There is no distension.     Palpations: Abdomen is soft.     Tenderness: There is no abdominal tenderness.  Lymphadenopathy:     Cervical: No cervical adenopathy.  Skin:    General: Skin is warm and dry.  Neurological:     Mental Status: She is alert and oriented to person, place, and time.  Psychiatric:        Behavior: Behavior normal.           Assessment & Plan:

## 2018-05-05 NOTE — Assessment & Plan Note (Signed)
Ongoing issue for pt.  She has lost 35lbs since September.  Applauded her efforts.  Encouraged her to add exercise to her [employer]System program.  Will continue to follow.

## 2018-05-05 NOTE — Patient Instructions (Addendum)
See Dr. Henrene Pastor in December.   Continue doing brain stimulating activities (puzzles, reading, adult coloring books, staying active) to keep memory sharp.   Bring a copy of your living will and/or healthcare power of attorney to your next office visit.   Health Maintenance, Female Adopting a healthy lifestyle and getting preventive care can go a long way to promote health and wellness. Talk with your health care provider about what schedule of regular examinations is right for you. This is a good chance for you to check in with your provider about disease prevention and staying healthy. In between checkups, there are plenty of things you can do on your own. Experts have done a lot of research about which lifestyle changes and preventive measures are most likely to keep you healthy. Ask your health care provider for more information. Weight and diet Eat a healthy diet  Be sure to include plenty of vegetables, fruits, low-fat dairy products, and lean protein.  Do not eat a lot of foods high in solid fats, added sugars, or salt.  Get regular exercise. This is one of the most important things you can do for your health. ? Most adults should exercise for at least 150 minutes each week. The exercise should increase your heart rate and make you sweat (moderate-intensity exercise). ? Most adults should also do strengthening exercises at least twice a week. This is in addition to the moderate-intensity exercise. Maintain a healthy weight  Body mass index (BMI) is a measurement that can be used to identify possible weight problems. It estimates body fat based on height and weight. Your health care provider can help determine your BMI and help you achieve or maintain a healthy weight.  For females 69 years of age and older: ? A BMI below 18.5 is considered underweight. ? A BMI of 18.5 to 24.9 is normal. ? A BMI of 25 to 29.9 is considered overweight. ? A BMI of 30 and above is considered obese. Watch  levels of cholesterol and blood lipids  You should start having your blood tested for lipids and cholesterol at 49 years of age, then have this test every 5 years.  You may need to have your cholesterol levels checked more often if: ? Your lipid or cholesterol levels are high. ? You are older than 49 years of age. ? You are at high risk for heart disease. Cancer screening Lung Cancer  Lung cancer screening is recommended for adults 25-61 years old who are at high risk for lung cancer because of a history of smoking.  A yearly low-dose CT scan of the lungs is recommended for people who: ? Currently smoke. ? Have quit within the past 15 years. ? Have at least a 30-pack-year history of smoking. A pack year is smoking an average of one pack of cigarettes a day for 1 year.  Yearly screening should continue until it has been 15 years since you quit.  Yearly screening should stop if you develop a health problem that would prevent you from having lung cancer treatment. Breast Cancer  Practice breast self-awareness. This means understanding how your breasts normally appear and feel.  It also means doing regular breast self-exams. Let your health care provider know about any changes, no matter how small.  If you are in your 20s or 30s, you should have a clinical breast exam (CBE) by a health care provider every 1-3 years as part of a regular health exam.  If you are 40 or older,  have a CBE every year. Also consider having a breast X-ray (mammogram) every year.  If you have a family history of breast cancer, talk to your health care provider about genetic screening.  If you are at high risk for breast cancer, talk to your health care provider about having an MRI and a mammogram every year.  Breast cancer gene (BRCA) assessment is recommended for women who have family members with BRCA-related cancers. BRCA-related cancers include: ? Breast. ? Ovarian. ? Tubal. ? Peritoneal  cancers.  Results of the assessment will determine the need for genetic counseling and BRCA1 and BRCA2 testing. Cervical Cancer Your health care provider may recommend that you be screened regularly for cancer of the pelvic organs (ovaries, uterus, and vagina). This screening involves a pelvic examination, including checking for microscopic changes to the surface of your cervix (Pap test). You may be encouraged to have this screening done every 3 years, beginning at age 57.  For women ages 36-65, health care providers may recommend pelvic exams and Pap testing every 3 years, or they may recommend the Pap and pelvic exam, combined with testing for human papilloma virus (HPV), every 5 years. Some types of HPV increase your risk of cervical cancer. Testing for HPV may also be done on women of any age with unclear Pap test results.  Other health care providers may not recommend any screening for nonpregnant women who are considered low risk for pelvic cancer and who do not have symptoms. Ask your health care provider if a screening pelvic exam is right for you.  If you have had past treatment for cervical cancer or a condition that could lead to cancer, you need Pap tests and screening for cancer for at least 20 years after your treatment. If Pap tests have been discontinued, your risk factors (such as having a new sexual partner) need to be reassessed to determine if screening should resume. Some women have medical problems that increase the chance of getting cervical cancer. In these cases, your health care provider may recommend more frequent screening and Pap tests. Colorectal Cancer  This type of cancer can be detected and often prevented.  Routine colorectal cancer screening usually begins at 49 years of age and continues through 49 years of age.  Your health care provider may recommend screening at an earlier age if you have risk factors for colon cancer.  Your health care provider may also  recommend using home test kits to check for hidden blood in the stool.  A small camera at the end of a tube can be used to examine your colon directly (sigmoidoscopy or colonoscopy). This is done to check for the earliest forms of colorectal cancer.  Routine screening usually begins at age 20.  Direct examination of the colon should be repeated every 5-10 years through 49 years of age. However, you may need to be screened more often if early forms of precancerous polyps or small growths are found. Skin Cancer  Check your skin from head to toe regularly.  Tell your health care provider about any new moles or changes in moles, especially if there is a change in a mole's shape or color.  Also tell your health care provider if you have a mole that is larger than the size of a pencil eraser.  Always use sunscreen. Apply sunscreen liberally and repeatedly throughout the day.  Protect yourself by wearing long sleeves, pants, a wide-brimmed hat, and sunglasses whenever you are outside. Heart disease, diabetes, and  high blood pressure  High blood pressure causes heart disease and increases the risk of stroke. High blood pressure is more likely to develop in: ? People who have blood pressure in the high end of the normal range (130-139/85-89 mm Hg). ? People who are overweight or obese. ? People who are African American.  If you are 54-37 years of age, have your blood pressure checked every 3-5 years. If you are 17 years of age or older, have your blood pressure checked every year. You should have your blood pressure measured twice-once when you are at a hospital or clinic, and once when you are not at a hospital or clinic. Record the average of the two measurements. To check your blood pressure when you are not at a hospital or clinic, you can use: ? An automated blood pressure machine at a pharmacy. ? A home blood pressure monitor.  If you are between 67 years and 28 years old, ask your health  care provider if you should take aspirin to prevent strokes.  Have regular diabetes screenings. This involves taking a blood sample to check your fasting blood sugar level. ? If you are at a normal weight and have a low risk for diabetes, have this test once every three years after 49 years of age. ? If you are overweight and have a high risk for diabetes, consider being tested at a younger age or more often. Preventing infection Hepatitis B  If you have a higher risk for hepatitis B, you should be screened for this virus. You are considered at high risk for hepatitis B if: ? You were born in a country where hepatitis B is common. Ask your health care provider which countries are considered high risk. ? Your parents were born in a high-risk country, and you have not been immunized against hepatitis B (hepatitis B vaccine). ? You have HIV or AIDS. ? You use needles to inject street drugs. ? You live with someone who has hepatitis B. ? You have had sex with someone who has hepatitis B. ? You get hemodialysis treatment. ? You take certain medicines for conditions, including cancer, organ transplantation, and autoimmune conditions. Hepatitis C  Blood testing is recommended for: ? Everyone born from [birth year] through 1966. ? Anyone with known risk factors for hepatitis C. Sexually transmitted infections (STIs)  You should be screened for sexually transmitted infections (STIs) including gonorrhea and chlamydia if: ? You are sexually active and are younger than 49 years of age. ? You are older than 49 years of age and your health care provider tells you that you are at risk for this type of infection. ? Your sexual activity has changed since you were last screened and you are at an increased risk for chlamydia or gonorrhea. Ask your health care provider if you are at risk.  If you do not have HIV, but are at risk, it may be recommended that you take a prescription medicine daily to prevent HIV  infection. This is called pre-exposure prophylaxis (PrEP). You are considered at risk if: ? You are sexually active and do not regularly use condoms or know the HIV status of your partner(s). ? You take drugs by injection. ? You are sexually active with a partner who has HIV. Talk with your health care provider about whether you are at high risk of being infected with HIV. If you choose to begin PrEP, you should first be tested for HIV. You should then be tested every 3  months for as long as you are taking PrEP. Pregnancy  If you are premenopausal and you may become pregnant, ask your health care provider about preconception counseling.  If you may become pregnant, take 400 to 800 micrograms (mcg) of folic acid every day.  If you want to prevent pregnancy, talk to your health care provider about birth control (contraception). Osteoporosis and menopause  Osteoporosis is a disease in which the bones lose minerals and strength with aging. This can result in serious bone fractures. Your risk for osteoporosis can be identified using a bone density scan.  If you are 46 years of age or older, or if you are at risk for osteoporosis and fractures, ask your health care provider if you should be screened.  Ask your health care provider whether you should take a calcium or vitamin D supplement to lower your risk for osteoporosis.  Menopause may have certain physical symptoms and risks.  Hormone replacement therapy may reduce some of these symptoms and risks. Talk to your health care provider about whether hormone replacement therapy is right for you. Follow these instructions at home:  Schedule regular health, dental, and eye exams.  Stay current with your immunizations.  Do not use any tobacco products including cigarettes, chewing tobacco, or electronic cigarettes.  If you are pregnant, do not drink alcohol.  If you are breastfeeding, limit how much and how often you drink alcohol.  Limit  alcohol intake to no more than 1 drink per day for nonpregnant women. One drink equals 12 ounces of beer, 5 ounces of wine, or 1 ounces of hard liquor.  Do not use street drugs.  Do not share needles.  Ask your health care provider for help if you need support or information about quitting drugs.  Tell your health care provider if you often feel depressed.  Tell your health care provider if you have ever been abused or do not feel safe at home. This information is not intended to replace advice given to you by your health care provider. Make sure you discuss any questions you have with your health care provider. Document Released: 09/02/2010 Document Revised: 07/26/2015 Document Reviewed: 11/21/2014 Elsevier Interactive Patient Education  2019 [employer].

## 2018-05-05 NOTE — Patient Instructions (Signed)
Schedule your complete physical in 6 months No need for labs today Keep up the good work!  You're doing great! I am so proud of your weight loss! Call with any questions or concerns Happy Early Birthday!

## 2018-06-30 DIAGNOSIS — F23 Brief psychotic disorder: Secondary | ICD-10-CM | POA: Diagnosis not present

## 2018-09-27 DIAGNOSIS — F23 Brief psychotic disorder: Secondary | ICD-10-CM | POA: Diagnosis not present

## 2018-10-22 ENCOUNTER — Other Ambulatory Visit: Payer: Self-pay | Admitting: Family Medicine

## 2018-12-22 DIAGNOSIS — F23 Brief psychotic disorder: Secondary | ICD-10-CM | POA: Diagnosis not present

## 2019-01-06 ENCOUNTER — Other Ambulatory Visit: Payer: Self-pay | Admitting: Family Medicine

## 2019-01-06 DIAGNOSIS — Z1231 Encounter for screening mammogram for malignant neoplasm of breast: Secondary | ICD-10-CM

## 2019-01-07 ENCOUNTER — Encounter: Payer: Self-pay | Admitting: Family Medicine

## 2019-01-07 ENCOUNTER — Other Ambulatory Visit: Payer: Self-pay

## 2019-01-07 ENCOUNTER — Ambulatory Visit (INDEPENDENT_AMBULATORY_CARE_PROVIDER_SITE_OTHER): Payer: PPO | Admitting: Family Medicine

## 2019-01-07 VITALS — BP 121/81 | HR 78 | Temp 98.0°F | Resp 16 | Ht 67.0 in | Wt 182.4 lb

## 2019-01-07 DIAGNOSIS — G43009 Migraine without aura, not intractable, without status migrainosus: Secondary | ICD-10-CM | POA: Diagnosis not present

## 2019-01-07 DIAGNOSIS — Z23 Encounter for immunization: Secondary | ICD-10-CM | POA: Diagnosis not present

## 2019-01-07 DIAGNOSIS — E669 Obesity, unspecified: Secondary | ICD-10-CM

## 2019-01-07 DIAGNOSIS — G809 Cerebral palsy, unspecified: Secondary | ICD-10-CM | POA: Diagnosis not present

## 2019-01-07 DIAGNOSIS — L989 Disorder of the skin and subcutaneous tissue, unspecified: Secondary | ICD-10-CM

## 2019-01-07 NOTE — Patient Instructions (Signed)
Schedule your complete physical in 6 months We'll notify you of your lab results and make any changes if needed Keep up the good work on healthy diet- you look great!! Apply hydrocortisone to the area on your lab Make sure you are wearing your brace to prevent falls When you get stressed out, remember to relax your shoulders and neck to prevent a headache Call with any questions or concerns Stay Safe!!!

## 2019-01-07 NOTE — Assessment & Plan Note (Signed)
Improved.  Thankfully the HAs she is describing are not consistent w/ her typical migraines but rather tension HA.  Discussed posture and stress management to improve sxs.

## 2019-01-07 NOTE — Assessment & Plan Note (Signed)
Pt has lost 20 lbs!  Was doing Nutrisystem and has done well.  Applauded her efforts.  Check labs to risk stratify.  Will follow.

## 2019-01-07 NOTE — Assessment & Plan Note (Signed)
Unchanged from previous.  Has seen specialist at [facility] and has a brace to wear on L foot.  Encouraged her to do this (she is not) to improve gait and prevent falls.  Will follow along.

## 2019-01-07 NOTE — Progress Notes (Signed)
   Subjective:    Patient ID: Holly Saunders, female    DOB: 1970-03-16, 48 y.o.   MRN: [id number]  HPI HA- pt reports HAs are less frequent, less severe.  Tend to occur w/ stress.  HAs will last ~1 day when they occur.  HAs tend to start in the back and then wrap around.  Spot on leg- L medial ankle, no pain or itching.  Area is dry/dark.  Obesity- pt is down 20 lbs since last visit.  Did very well on NutriSystem.  CP- pt is using her walker all the time, increased falls.  No improvement w/ PT.  Has seen specialist who did not recommend surgery.  Pt has orthotic for L leg but isn't wearing it.   Review of Systems For ROS see HPI     Objective:   Physical Exam Vitals signs reviewed.  Constitutional:      General: She is not in acute distress.    Appearance: She is well-developed.  HENT:     Head: Normocephalic and atraumatic.  Eyes:     Conjunctiva/sclera: Conjunctivae normal.     Pupils: Pupils are equal, round, and reactive to light.  Neck:     Musculoskeletal: Normal range of motion and neck supple.     Thyroid: No thyromegaly.  Cardiovascular:     Rate and Rhythm: Normal rate and regular rhythm.     Heart sounds: Normal heart sounds. No murmur.  Pulmonary:     Effort: Pulmonary effort is normal. No respiratory distress.     Breath sounds: Normal breath sounds.  Abdominal:     General: There is no distension.     Palpations: Abdomen is soft.     Tenderness: There is no abdominal tenderness.  Lymphadenopathy:     Cervical: No cervical adenopathy.  Skin:    General: Skin is warm and dry.     Findings: Lesion (dime sized circular area of dry skin overlying hypertrophic, hyperpigmented base) present.  Neurological:     Mental Status: She is alert and oriented to person, place, and time. Mental status is at baseline.  Psychiatric:        Behavior: Behavior normal.           Assessment & Plan:  Skin lesion- new to provider, has been present on pt for years.  No  itching or burning.  No pain.  Area is consistent w/ dry skin and chronic friction.  Apply hydrocortisone to improve flaking and hyperpigmentation.  Pt expressed understanding and is in agreement w/ plan.

## 2019-01-08 LAB — HEPATIC FUNCTION PANEL
AG Ratio: 1.7 (calc) (ref 1.0–2.5)
ALT: 14 U/L (ref 6–29)
AST: 20 U/L (ref 10–35)
Albumin: 4.4 g/dL (ref 3.6–5.1)
Alkaline phosphatase (APISO): 64 U/L (ref 31–125)
Bilirubin, Direct: 0.1 mg/dL (ref 0.0–0.2)
Globulin: 2.6 g/dL (calc) (ref 1.9–3.7)
Indirect Bilirubin: 0.5 mg/dL (calc) (ref 0.2–1.2)
Total Bilirubin: 0.6 mg/dL (ref 0.2–1.2)
Total Protein: 7 g/dL (ref 6.1–8.1)

## 2019-01-08 LAB — CBC WITH DIFFERENTIAL/PLATELET
Absolute Monocytes: 332 cells/uL (ref 200–950)
Basophils Absolute: 103 cells/uL (ref 0–200)
Basophils Relative: 1.3 %
Eosinophils Absolute: 119 cells/uL (ref 15–500)
Eosinophils Relative: 1.5 %
HCT: 45.7 % — ABNORMAL HIGH (ref 35.0–45.0)
Hemoglobin: 15.2 g/dL (ref 11.7–15.5)
Lymphs Abs: 2094 cells/uL (ref 850–3900)
MCH: 27.7 pg (ref 27.0–33.0)
MCHC: 33.3 g/dL (ref 32.0–36.0)
MCV: 83.2 fL (ref 80.0–100.0)
MPV: 10.4 fL (ref 7.5–12.5)
Monocytes Relative: 4.2 %
Neutro Abs: 5254 cells/uL (ref 1500–7800)
Neutrophils Relative %: 66.5 %
Platelets: 265 10*3/uL (ref 140–400)
RBC: 5.49 10*6/uL — ABNORMAL HIGH (ref 3.80–5.10)
RDW: 12.1 % (ref 11.0–15.0)
Total Lymphocyte: 26.5 %
WBC: 7.9 10*3/uL (ref 3.8–10.8)

## 2019-01-08 LAB — LIPID PANEL
Cholesterol: 216 mg/dL — ABNORMAL HIGH (ref <200)
HDL: 54 mg/dL (ref >=50)
LDL Cholesterol (Calc): 141 mg/dL (calc) — ABNORMAL HIGH
Non-HDL Cholesterol (Calc): 162 mg/dL (calc) — ABNORMAL HIGH (ref <130)
Total CHOL/HDL Ratio: 4 (calc) (ref <5.0)
Triglycerides: 99 mg/dL (ref <150)

## 2019-01-08 LAB — BASIC METABOLIC PANEL
BUN: 19 mg/dL (ref 7–25)
CO2: 23 mmol/L (ref 20–32)
Calcium: 9.2 mg/dL (ref 8.6–10.2)
Chloride: 103 mmol/L (ref 98–110)
Creat: 0.87 mg/dL (ref 0.50–1.10)
Glucose, Bld: 87 mg/dL (ref 65–99)
Potassium: 4.6 mmol/L (ref 3.5–5.3)
Sodium: 138 mmol/L (ref 135–146)

## 2019-01-08 LAB — TSH: TSH: 4.41 mIU/L

## 2019-01-22 IMAGING — MG DIGITAL SCREENING BILATERAL MAMMOGRAM WITH CAD
5 series · 5 of 5 positions shown · non-contrast
Comparison: Previous exam(s).

CLINICAL DATA: Screening.

EXAM:
DIGITAL SCREENING BILATERAL MAMMOGRAM WITH CAD

[L MLO]
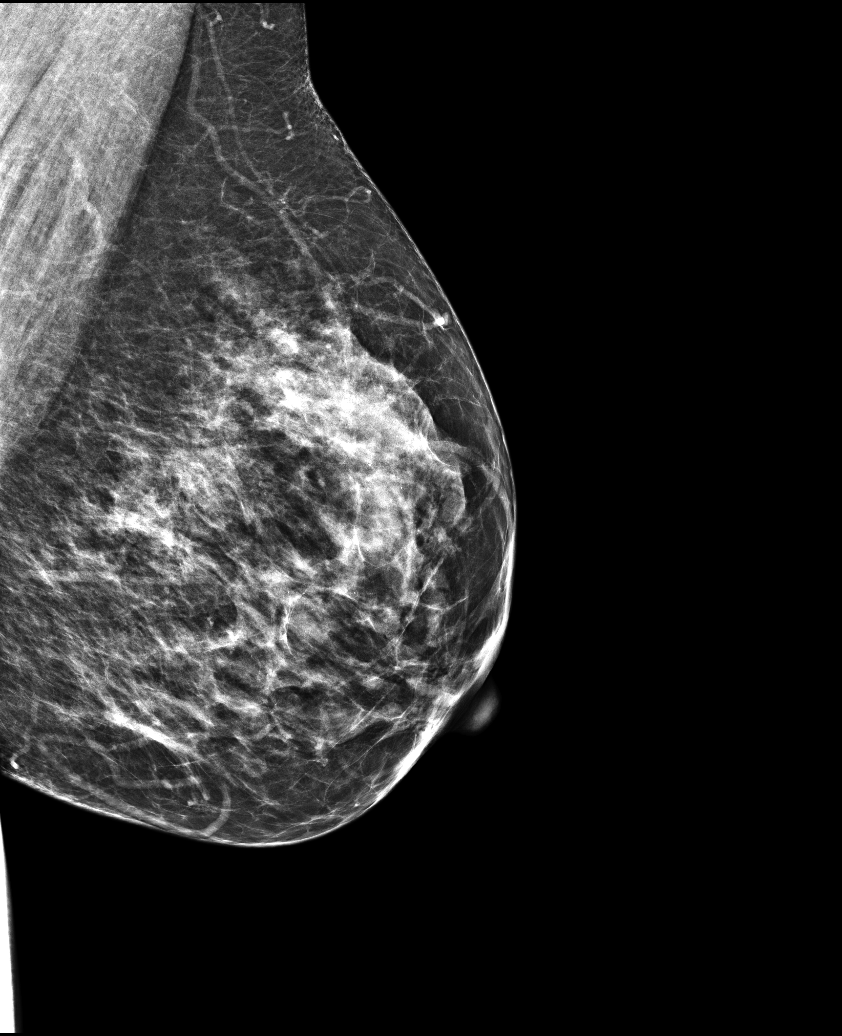

[R MLO (1 of 2)]
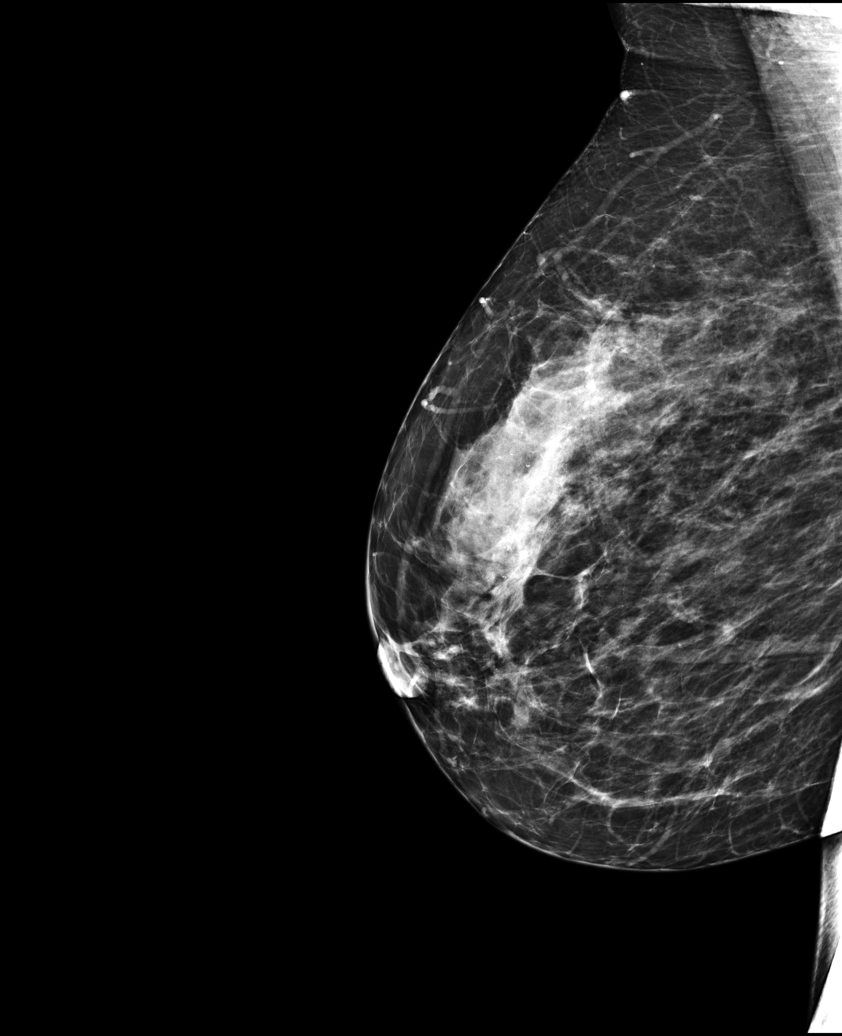

[R CC]
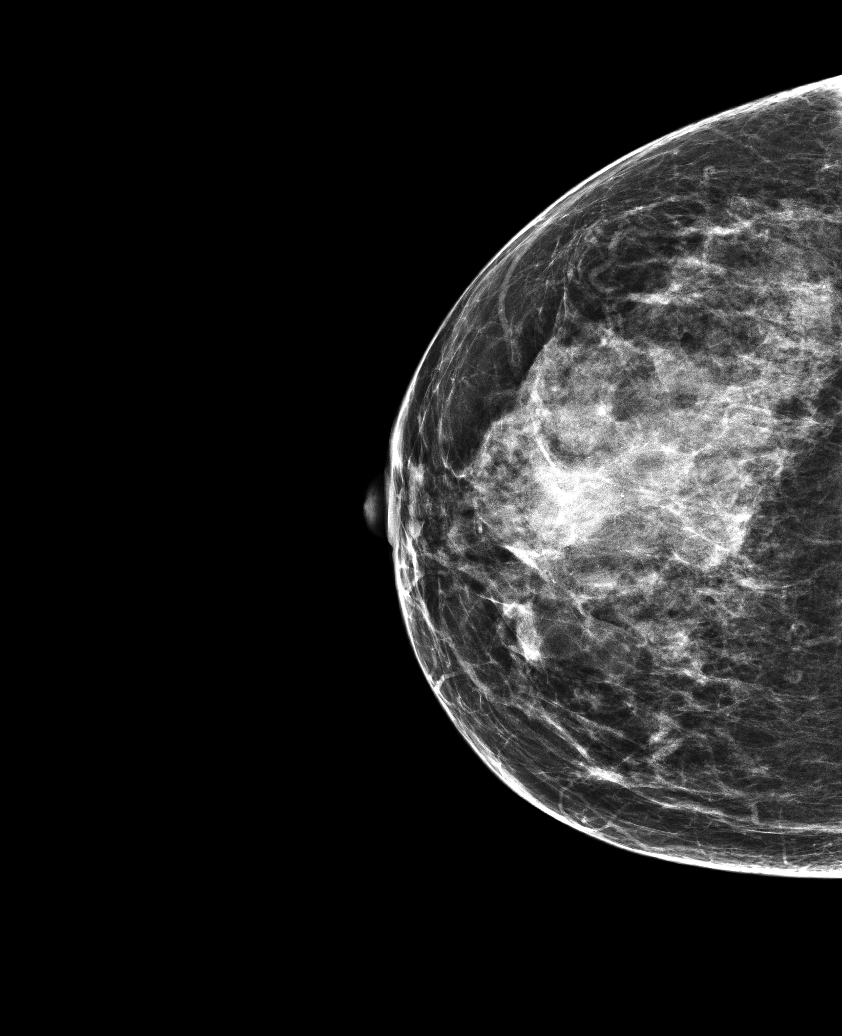

[L CC]
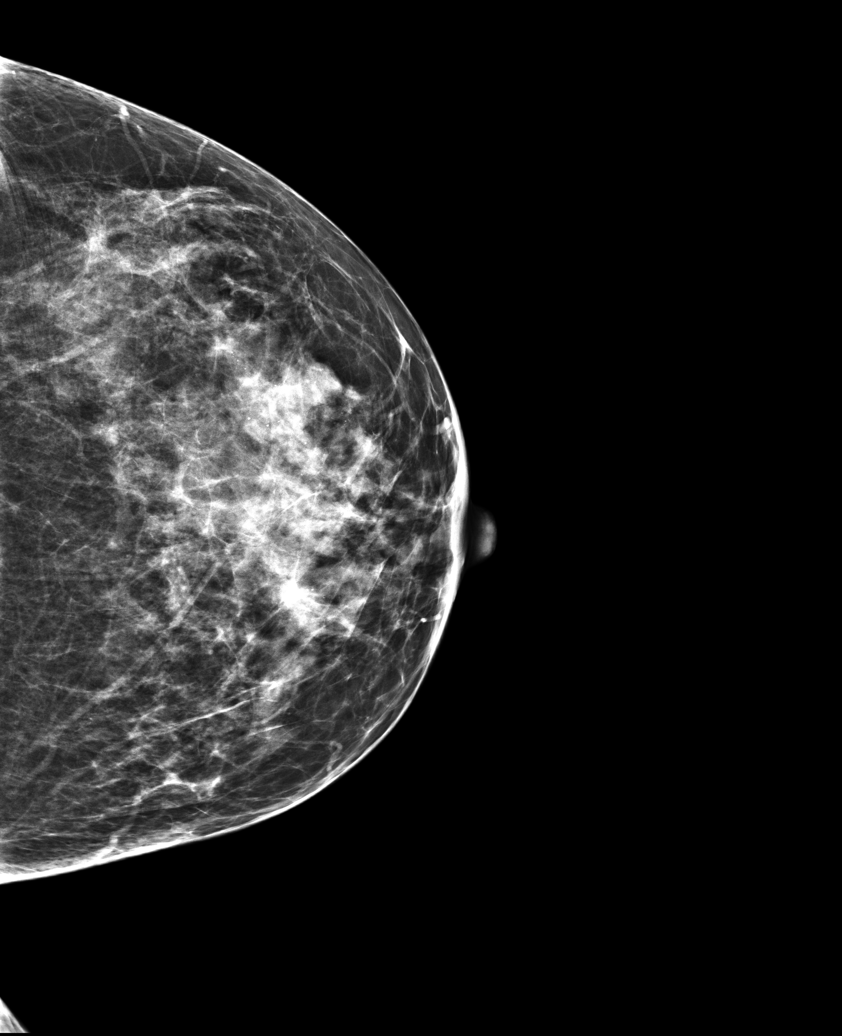

[R MLO (2 of 2)]
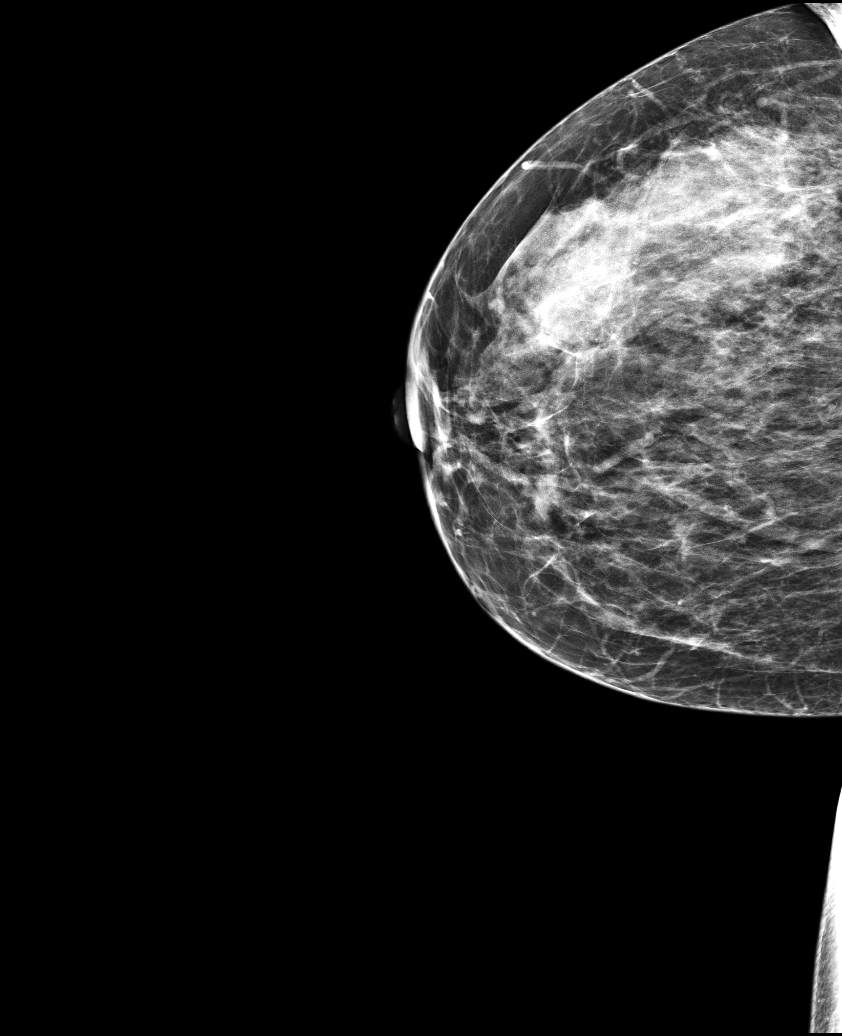

[5 of 5 positions shown; findings below may reference images not displayed]

ACR Breast Density Category c: The breast tissue is heterogeneously
dense, which may obscure small masses.
FINDINGS: There are no findings suspicious for malignancy. Images were
processed with CAD.
IMPRESSION: No mammographic evidence of malignancy. A result letter of this
screening mammogram will be mailed directly to the patient.

RECOMMENDATION:
Screening mammogram in one year. (Code:[id number])

BI-RADS CATEGORY  1: Negative.

## 2019-01-28 ENCOUNTER — Other Ambulatory Visit: Payer: Self-pay | Admitting: Family Medicine

## 2019-01-31 NOTE — Telephone Encounter (Signed)
Please advise pt mom had said she was not on this.

## 2019-02-02 ENCOUNTER — Encounter: Payer: Self-pay | Admitting: Internal Medicine

## 2019-02-09 ENCOUNTER — Other Ambulatory Visit: Payer: Self-pay

## 2019-02-09 ENCOUNTER — Ambulatory Visit (INDEPENDENT_AMBULATORY_CARE_PROVIDER_SITE_OTHER): Payer: PPO

## 2019-02-09 DIAGNOSIS — Z1231 Encounter for screening mammogram for malignant neoplasm of breast: Secondary | ICD-10-CM

## 2019-02-15 ENCOUNTER — Telehealth: Payer: Self-pay | Admitting: Family Medicine

## 2019-02-15 NOTE — Telephone Encounter (Signed)
Pt called in asking for a refill on the oxybutynin. Pt uses [facility] [address]

## 2019-02-15 NOTE — Telephone Encounter (Signed)
Please advise if ok ?

## 2019-02-16 ENCOUNTER — Other Ambulatory Visit: Payer: Self-pay | Admitting: General Practice

## 2019-02-16 MED ORDER — OXYBUTYNIN CHLORIDE 5 MG PO TABS: 5.0000 mg | ORAL_TABLET | Freq: Two times a day (BID) | ORAL | 1 refills | Status: DC

## 2019-02-16 NOTE — Telephone Encounter (Signed)
Medication filled to pharmacy as requested.   

## 2019-02-16 NOTE — Telephone Encounter (Signed)
Ok to refill prescription for 90 days and 1 refill

## 2019-03-08 DIAGNOSIS — Z1383 Encounter for screening for respiratory disorder NEC: Secondary | ICD-10-CM | POA: Diagnosis not present

## 2019-03-08 DIAGNOSIS — Z20828 Contact with and (suspected) exposure to other viral communicable diseases: Secondary | ICD-10-CM | POA: Diagnosis not present

## 2019-03-21 DIAGNOSIS — F23 Brief psychotic disorder: Secondary | ICD-10-CM | POA: Diagnosis not present

## 2019-06-07 DIAGNOSIS — Z20828 Contact with and (suspected) exposure to other viral communicable diseases: Secondary | ICD-10-CM | POA: Diagnosis not present

## 2019-06-07 DIAGNOSIS — Z1383 Encounter for screening for respiratory disorder NEC: Secondary | ICD-10-CM | POA: Diagnosis not present

## 2019-06-08 DIAGNOSIS — F23 Brief psychotic disorder: Secondary | ICD-10-CM | POA: Diagnosis not present

## 2019-07-07 DIAGNOSIS — Z1383 Encounter for screening for respiratory disorder NEC: Secondary | ICD-10-CM | POA: Diagnosis not present

## 2019-07-07 DIAGNOSIS — Z20828 Contact with and (suspected) exposure to other viral communicable diseases: Secondary | ICD-10-CM | POA: Diagnosis not present

## 2019-08-11 ENCOUNTER — Other Ambulatory Visit: Payer: Self-pay | Admitting: Family Medicine

## 2019-09-12 DIAGNOSIS — F23 Brief psychotic disorder: Secondary | ICD-10-CM | POA: Diagnosis not present

## 2019-11-02 ENCOUNTER — Other Ambulatory Visit: Payer: Self-pay | Admitting: General Practice

## 2019-11-02 ENCOUNTER — Telehealth: Payer: Self-pay | Admitting: Family Medicine

## 2019-11-02 MED ORDER — OXYBUTYNIN CHLORIDE 5 MG PO TABS
5.0000 mg | ORAL_TABLET | Freq: Two times a day (BID) | ORAL | 1 refills | Status: DC
Start: 2019-11-02 — End: 2020-07-12

## 2019-11-02 NOTE — Telephone Encounter (Signed)
Chart updated to reflect pharmacy change.

## 2019-11-02 NOTE — Telephone Encounter (Signed)
Pt mother called stating she would like pt prescriptions to be changed over to [facility] [address] in [place]. Please advise.

## 2019-12-12 DIAGNOSIS — F23 Brief psychotic disorder: Secondary | ICD-10-CM | POA: Diagnosis not present

## 2020-03-12 DIAGNOSIS — F23 Brief psychotic disorder: Secondary | ICD-10-CM | POA: Diagnosis not present

## 2020-06-06 DIAGNOSIS — F23 Brief psychotic disorder: Secondary | ICD-10-CM | POA: Diagnosis not present

## 2020-07-12 ENCOUNTER — Other Ambulatory Visit: Payer: Self-pay

## 2020-07-12 DIAGNOSIS — N3941 Urge incontinence: Secondary | ICD-10-CM

## 2020-07-12 MED ORDER — OXYBUTYNIN CHLORIDE 5 MG PO TABS: 5.0000 mg | ORAL_TABLET | Freq: Two times a day (BID) | ORAL | 0 refills | Status: DC

## 2020-09-05 DIAGNOSIS — F23 Brief psychotic disorder: Secondary | ICD-10-CM | POA: Diagnosis not present

## 2020-10-18 ENCOUNTER — Other Ambulatory Visit: Payer: Self-pay | Admitting: Family Medicine

## 2020-10-18 DIAGNOSIS — N3941 Urge incontinence: Secondary | ICD-10-CM

## 2020-11-10 ENCOUNTER — Other Ambulatory Visit: Payer: Self-pay | Admitting: Family Medicine

## 2020-11-10 DIAGNOSIS — N3941 Urge incontinence: Secondary | ICD-10-CM

## 2020-12-03 DIAGNOSIS — F23 Brief psychotic disorder: Secondary | ICD-10-CM | POA: Diagnosis not present

## 2021-02-08 ENCOUNTER — Other Ambulatory Visit: Payer: Self-pay | Admitting: Family Medicine

## 2021-02-08 DIAGNOSIS — N3941 Urge incontinence: Secondary | ICD-10-CM

## 2021-03-06 DIAGNOSIS — F23 Brief psychotic disorder: Secondary | ICD-10-CM | POA: Diagnosis not present

## 2021-03-27 ENCOUNTER — Other Ambulatory Visit: Payer: Self-pay | Admitting: Family Medicine

## 2021-03-27 DIAGNOSIS — N3941 Urge incontinence: Secondary | ICD-10-CM

## 2021-04-10 ENCOUNTER — Encounter: Payer: Self-pay | Admitting: Family Medicine

## 2021-04-10 ENCOUNTER — Ambulatory Visit (INDEPENDENT_AMBULATORY_CARE_PROVIDER_SITE_OTHER): Payer: PPO | Admitting: Family Medicine

## 2021-04-10 VITALS — BP 138/80 | HR 68 | Temp 97.3°F | Resp 16 | Wt 211.8 lb

## 2021-04-10 DIAGNOSIS — E669 Obesity, unspecified: Secondary | ICD-10-CM | POA: Diagnosis not present

## 2021-04-10 DIAGNOSIS — Z1211 Encounter for screening for malignant neoplasm of colon: Secondary | ICD-10-CM

## 2021-04-10 DIAGNOSIS — N3946 Mixed incontinence: Secondary | ICD-10-CM

## 2021-04-10 DIAGNOSIS — F3341 Major depressive disorder, recurrent, in partial remission: Secondary | ICD-10-CM | POA: Diagnosis not present

## 2021-04-10 DIAGNOSIS — G43009 Migraine without aura, not intractable, without status migrainosus: Secondary | ICD-10-CM | POA: Diagnosis not present

## 2021-04-10 DIAGNOSIS — G809 Cerebral palsy, unspecified: Secondary | ICD-10-CM | POA: Diagnosis not present

## 2021-04-10 DIAGNOSIS — Z1231 Encounter for screening mammogram for malignant neoplasm of breast: Secondary | ICD-10-CM

## 2021-04-10 DIAGNOSIS — Z23 Encounter for immunization: Secondary | ICD-10-CM | POA: Diagnosis not present

## 2021-04-10 MED ORDER — SUMATRIPTAN SUCCINATE 50 MG PO TABS: 50.0000 mg | ORAL_TABLET | Freq: Once | ORAL | 3 refills | Status: DC

## 2021-04-10 MED ORDER — OXYBUTYNIN CHLORIDE ER 10 MG PO TB24: 10.0000 mg | ORAL_TABLET | Freq: Every day | ORAL | 3 refills | Status: DC

## 2021-04-10 NOTE — Patient Instructions (Signed)
Schedule your complete physical in 6 months We'll notify you of your lab results and make any changes if needed Change the Oxybutynin to once nightly Take the Sumatriptan as needed for migraines We'll call you to schedule your mammogram and GI appts Continue to work on healthy diet and regular exercise- you can do it! Call with any questions or concerns Happy Valentine's Day!!!

## 2021-04-10 NOTE — Progress Notes (Signed)
° °  Subjective:    Patient ID: Holly Saunders, female    DOB: [birth year], 51 y.o.   MRN: [id number]  HPI Depression- chronic problem, on Prozac 20mg  daily.  Pt feels that this is the right dose.  Still in her relationship and things are going well.  Reports feeling safe in her relationship.  Obesity- pt has gained 30 lbs since last visit in 2020.  Pt reports she tries to do some type of walking but not currently doing any formal exercise.  Was previously doing NutriSystem but stopped.  OAB- pt reports Oxybutynin has been helpful but that she may need an increased dose.  Does have some stress incontinence.  Migraines- pt is out of Imitrex and would like a refill.  Is trying to determine if there are any specific triggers.  Is now wearing her glasses for computer work and reading.  CP- unchanged.  Due for mammo and repeat colonoscopy   Review of Systems For ROS see HPI   This visit occurred during the SARS-CoV-2 public health emergency.  Safety protocols were in place, including screening questions prior to the visit, additional usage of staff PPE, and extensive cleaning of exam room while observing appropriate contact time as indicated for disinfecting solutions.      Objective:   Physical Exam Vitals reviewed.  Constitutional:      General: She is not in acute distress.    Appearance: She is well-developed. She is obese. She is not ill-appearing.  HENT:     Head: Normocephalic and atraumatic.  Eyes:     Conjunctiva/sclera: Conjunctivae normal.     Pupils: Pupils are equal, round, and reactive to light.  Neck:     Thyroid: No thyromegaly.  Cardiovascular:     Rate and Rhythm: Normal rate and regular rhythm.     Heart sounds: Normal heart sounds. No murmur heard. Pulmonary:     Effort: Pulmonary effort is normal. No respiratory distress.     Breath sounds: Normal breath sounds.  Abdominal:     General: There is no distension.     Palpations: Abdomen is soft.     Tenderness:  There is no abdominal tenderness.  Musculoskeletal:     Cervical back: Normal range of motion and neck supple.  Lymphadenopathy:     Cervical: No cervical adenopathy.  Skin:    General: Skin is warm and dry.  Neurological:     Mental Status: She is alert and oriented to person, place, and time. Mental status is at baseline.     Coordination: Coordination abnormal.     Gait: Gait abnormal.  Psychiatric:        Mood and Affect: Mood normal.        Behavior: Behavior normal.          Assessment & Plan:

## 2021-04-11 LAB — CBC WITH DIFFERENTIAL/PLATELET
Basophils Absolute: 0.1 10*3/uL (ref 0.0–0.1)
Basophils Relative: 1 % (ref 0.0–3.0)
Eosinophils Absolute: 0.1 10*3/uL (ref 0.0–0.7)
Eosinophils Relative: 1.8 % (ref 0.0–5.0)
HCT: 43.3 % (ref 36.0–46.0)
Hemoglobin: 14.4 g/dL (ref 12.0–15.0)
Lymphocytes Relative: 25.8 % (ref 12.0–46.0)
Lymphs Abs: 1.8 10*3/uL (ref 0.7–4.0)
MCHC: 33.3 g/dL (ref 30.0–36.0)
MCV: 81.5 fl (ref 78.0–100.0)
Monocytes Absolute: 0.3 10*3/uL (ref 0.1–1.0)
Monocytes Relative: 4.6 % (ref 3.0–12.0)
Neutro Abs: 4.6 10*3/uL (ref 1.4–7.7)
Neutrophils Relative %: 66.8 % (ref 43.0–77.0)
Platelets: 238 10*3/uL (ref 150.0–400.0)
RBC: 5.3 Mil/uL — ABNORMAL HIGH (ref 3.87–5.11)
RDW: 13.2 % (ref 11.5–15.5)
WBC: 6.8 10*3/uL (ref 4.0–10.5)

## 2021-04-11 LAB — LIPID PANEL
Cholesterol: 233 mg/dL — ABNORMAL HIGH (ref 0–200)
HDL: 57.1 mg/dL (ref >39.00)
LDL Cholesterol: 154 mg/dL — ABNORMAL HIGH (ref 0–99)
NonHDL: 176.07
Total CHOL/HDL Ratio: 4
Triglycerides: 109 mg/dL (ref 0.0–149.0)
VLDL: 21.8 mg/dL (ref 0.0–40.0)

## 2021-04-11 LAB — HEPATIC FUNCTION PANEL
ALT: 12 U/L (ref 0–35)
AST: 15 U/L (ref 0–37)
Albumin: 4.4 g/dL (ref 3.5–5.2)
Alkaline Phosphatase: 73 U/L (ref 39–117)
Bilirubin, Direct: 0.1 mg/dL (ref 0.0–0.3)
Total Bilirubin: 0.6 mg/dL (ref 0.2–1.2)
Total Protein: 7.1 g/dL (ref 6.0–8.3)

## 2021-04-11 LAB — BASIC METABOLIC PANEL
BUN: 15 mg/dL (ref 6–23)
CO2: 29 mEq/L (ref 19–32)
Calcium: 9.4 mg/dL (ref 8.4–10.5)
Chloride: 105 mEq/L (ref 96–112)
Creatinine, Ser: 1.03 mg/dL (ref 0.40–1.20)
GFR: 62.8 mL/min (ref >60.00)
Glucose, Bld: 86 mg/dL (ref 70–99)
Potassium: 4.3 mEq/L (ref 3.5–5.1)
Sodium: 141 mEq/L (ref 135–145)

## 2021-04-11 LAB — TSH: TSH: 5.9 u[IU]/mL — ABNORMAL HIGH (ref 0.35–5.50)

## 2021-04-12 ENCOUNTER — Telehealth: Payer: Self-pay

## 2021-04-12 NOTE — Telephone Encounter (Signed)
-----   Message from Midge Minium, MD sent at 04/12/2021  7:25 AM EST ----- TSH is elevated meaning that your thyroid is underfunctioning.  We will start Levothyroxine 80mcg daily and repeat your TSH at a lab only visit in 1 month  Total cholesterol and LDL (bad cholesterol) are both above goal.  This will improve w/ healthy diet and regular physical activity like walking.  In the meantime, you can add OTC Red Yeast Rice supplement daily to improve these numbers.  Remainder of labs look good

## 2021-04-15 ENCOUNTER — Telehealth: Payer: Self-pay

## 2021-04-15 NOTE — Telephone Encounter (Signed)
Called back and informed father of pt (on [place])  He wrote down results and will follow up

## 2021-04-15 NOTE — Telephone Encounter (Signed)
Lvm for patient to return call about labs 

## 2021-04-15 NOTE — Assessment & Plan Note (Signed)
Ongoing issue for pt.  She feels Prozac 20mg  is the appropriate dose and feels sxs are well controlled.  No med changes at this time.  Will follow.

## 2021-04-15 NOTE — Telephone Encounter (Signed)
Caller name:Gayle Candis Schatz (Mom)  On DPR? :No  Call back number:[phone]  Provider they see: Birdie Riddle   Reason for call:returning call about labs

## 2021-04-15 NOTE — Assessment & Plan Note (Signed)
Unchanged.  Pt still lives w/ mom and dad.  Has a long term significant other.  Does not require additional intervention at this time

## 2021-04-15 NOTE — Assessment & Plan Note (Signed)
Pt has gained 30 lbs since last visit in 2020.  She is not currently doing any formal exercise or following any specific diet.  Was previously doing NutriSystem but stopped.  Encouraged low carb diet and regular physical activity.  Will follow.

## 2021-04-15 NOTE — Assessment & Plan Note (Signed)
Ongoing issue for pt.  Needs refill on Imitrex.  She is attempting to determine if there are any specific triggers.  Now wearing her glasses more frequently- which may help.  Will follow.

## 2021-04-15 NOTE — Assessment & Plan Note (Signed)
Pt has mix of OAB and stress incontinence.  Will increase the Oxybutynin dose to see if she can get better control of her sxs.  If not, discussed that we may need a referral to Urology for complete evaluation.  Pt expressed understanding and is in agreement w/ plan.

## 2021-04-17 ENCOUNTER — Telehealth: Payer: Self-pay

## 2021-04-17 ENCOUNTER — Other Ambulatory Visit: Payer: Self-pay

## 2021-04-17 DIAGNOSIS — R7989 Other specified abnormal findings of blood chemistry: Secondary | ICD-10-CM

## 2021-04-17 MED ORDER — LEVOTHYROXINE SODIUM 50 MCG PO TABS: 50.0000 ug | ORAL_TABLET | Freq: Every day | ORAL | 0 refills | Status: DC

## 2021-04-17 NOTE — Telephone Encounter (Signed)
Pt mother was informed

## 2021-04-17 NOTE — Telephone Encounter (Signed)
Med sent, lab visit scheduled and lab ordered. Patient is aware

## 2021-04-17 NOTE — Telephone Encounter (Signed)
Caller name: Cynia Abruzzo   On DPR? :No    Call back number:  Provider they see: Birdie Riddle   Reason for call:Pt has not got thyroid medication sent to pharmacy yet? Notes say Pt is going to be put on tyroid medication

## 2021-04-24 ENCOUNTER — Other Ambulatory Visit: Payer: Self-pay

## 2021-04-24 ENCOUNTER — Ambulatory Visit (INDEPENDENT_AMBULATORY_CARE_PROVIDER_SITE_OTHER): Payer: PPO

## 2021-04-24 DIAGNOSIS — Z1231 Encounter for screening mammogram for malignant neoplasm of breast: Secondary | ICD-10-CM

## 2021-05-15 ENCOUNTER — Telehealth: Payer: Self-pay

## 2021-05-15 ENCOUNTER — Other Ambulatory Visit (INDEPENDENT_AMBULATORY_CARE_PROVIDER_SITE_OTHER): Payer: PPO

## 2021-05-15 DIAGNOSIS — R7989 Other specified abnormal findings of blood chemistry: Secondary | ICD-10-CM | POA: Diagnosis not present

## 2021-05-15 LAB — TSH: TSH: 2.59 u[IU]/mL (ref 0.35–5.50)

## 2021-05-15 NOTE — Telephone Encounter (Signed)
Caller name:Lamonda Mare Ferrari Edd Fabian mother)  ? ?On DPR? :Yes ? ?Call back [phone] ? ?Provider they see: Birdie Riddle  ? ?Reason for call:Pt mother is requesting to see if Dr Birdie Riddle could write for electric scooter  ? ?

## 2021-05-15 NOTE — Telephone Encounter (Signed)
For medicare to cover an electric scooter, typically this requires a PT evaluation to ensure pt is able to operate it safely.  Unless they want to pay out of pocket, it is not as simple as writing an order/prescription.  Probably the best way to do this is a home health referral for evaluation but if she hasn't been here in the last 30 days, we need to see her first (home health requirement) ?

## 2021-05-16 NOTE — Telephone Encounter (Signed)
Noted. Needs to be in-person - I see that it is, but again, home health requirement. ?Thanks, ? ?Rich

## 2021-05-16 NOTE — Telephone Encounter (Signed)
Patient's mother wanted her to be seen on Monday when she comes in for her appt to make it easier on travel. Dr Birdie Riddle didn't have anything, I put her on Richard's schedule.  ?

## 2021-05-17 ENCOUNTER — Telehealth: Payer: Self-pay

## 2021-05-17 NOTE — Telephone Encounter (Signed)
LVM for patient to return call about labs ?

## 2021-05-17 NOTE — Telephone Encounter (Signed)
-----   Message from Midge Minium, MD sent at 05/16/2021  7:41 AM EDT ----- ?TSH now normal.  Great news!  No med changes at this time ?

## 2021-05-20 ENCOUNTER — Encounter: Payer: Self-pay | Admitting: Registered Nurse

## 2021-05-20 ENCOUNTER — Ambulatory Visit (INDEPENDENT_AMBULATORY_CARE_PROVIDER_SITE_OTHER): Payer: PPO | Admitting: Registered Nurse

## 2021-05-20 VITALS — BP 141/87 | HR 69 | Temp 98.2°F | Resp 18 | Ht 67.0 in | Wt 214.0 lb

## 2021-05-20 DIAGNOSIS — G809 Cerebral palsy, unspecified: Secondary | ICD-10-CM | POA: Diagnosis not present

## 2021-05-20 DIAGNOSIS — Z7409 Other reduced mobility: Secondary | ICD-10-CM | POA: Diagnosis not present

## 2021-05-20 DIAGNOSIS — Z9181 History of falling: Secondary | ICD-10-CM | POA: Diagnosis not present

## 2021-05-20 NOTE — Patient Instructions (Addendum)
Ms. Meath -  ? ?Great to see you ? ?Home health referral started. They will assess for scooter need and it should all come together from there ? ?Thank you ? ?Rich  ? ? ? ?If you have lab work done today you will be contacted with your lab results within the next 2 weeks.  If you have not heard from [place] then please contact us. The fastest way to get your results is to register for My Chart. ? ? ?IF you received an x-ray today, you will receive an invoice from [facility]. Please contact [facility] at [phone] with questions or concerns regarding your invoice.  ? ?IF you received labwork today, you will receive an invoice from [place]. Please contact [facility] at [phone] with questions or concerns regarding your invoice.  ? ?Our billing staff will not be able to assist you with questions regarding bills from these companies. ? ?You will be contacted with the lab results as soon as they are available. The fastest way to get your results is to activate your My Chart account. Instructions are located on the last page of this paperwork. If you have not heard from [place] regarding the results in 2 weeks, please contact this office. ?  ? ? ?

## 2021-05-20 NOTE — Progress Notes (Signed)
? ?Established Patient Office Visit ? ?Subjective:  ?Patient ID: Holly Saunders, female    DOB: [birth year]  Age: 51 y.o. MRN: [id number] ? ?CC:  ?Chief Complaint  ?Patient presents with  ? Follow-up  ?  Patient states she is here to discuss lab results and needs an electric scooter  ? ? ?HPI ?[name] presents for follow up on labs, [facility] referral ? ?Labs ?Noted to have elevated TSH to 5.9 around 1 mo ago ?Started on synthroid 41mg po qd by PCP ?Doing well, no AE. ?Last TSH 5 days ago, wnl.  ?Discussed results and thyroid function with pt. ? [place] ?Thinks she would be good candidate for scooter.  ?High fall risk, cerebral palsy, chronic fatigue. ?Would need PT assessment per PCP, HH would be easiest way to accomplish this. ? ? ?Past Medical History:  ?Diagnosis Date  ? Allergy   ? Anxiety   ? Cardiac murmur   ? [place]   ? [place] ([place]   ? Colon polyps   ? Ear infection   ? recurrent  ? Headache(784.0)   ? migraines  ? Hyperlipidemia   ? Hypertension   ? not being treated for it at present time  ? PONV (postoperative nausea and vomiting)   ? Seasonal allergies   ? ? ?Past Surgical History:  ?Procedure Laterality Date  ? ABDOMINAL HYSTERECTOMY    ? CHOLECYSTECTOMY N/A 05/03/2013  ? Procedure: LAPAROSCOPIC CHOLECYSTECTOMY WITH INTRAOPERATIVE CHOLANGIOGRAM;  Surgeon: TJoyice Faster Cornett, MD;  [place]  Service: General;  Laterality: N/A;  ? EYE SURGERY Bilateral   ? for cross eye  ? heel cord surgery Bilateral   ? HIP SURGERY    ? bialteral  ? INCISION AND DRAINAGE ABSCESS Right   ? axilla  ? LAPAROSCOPIC SUPRACERVICAL HYSTERECTOMY  01/31/09  ? TONSILLECTOMY    ? ? ?Family History  ?Problem Relation Age of Onset  ? Hypertension Mother   ? Hyperlipidemia Mother   ? Colon polyps Mother   ? Irritable bowel syndrome Mother   ? Ulcerative colitis Father   ? Colon cancer Father   ?     not sure, he has a colostomy  ? Cancer Father   ?     blood  ? Esophageal cancer Neg Hx   ? Rectal cancer  Neg Hx   ? Stomach cancer Neg Hx   ? ? ?Social History  ? ?Socioeconomic History  ? Marital status: Divorced  ?  Spouse name: Not on file  ? Number of children: Not on file  ? Years of education: Not on file  ? Highest education level: Not on file  ?Occupational History  ? Not on file  ?Tobacco Use  ? Smoking status: Never  ? Smokeless tobacco: Never  ?Vaping Use  ? Vaping Use: Never used  ?Substance and Sexual Activity  ? Alcohol use: Yes  ?  Comment: occasionally  ? Drug use: No  ? Sexual activity: Never  ?Other Topics Concern  ? Not on file  ?Social History Narrative  ? Not on file  ? ?Social Determinants of Health  ? ?Financial Resource Strain: Not on file  ?Food Insecurity: Not on file  ?Transportation Needs: Not on file  ?Physical Activity: Not on file  ?Stress: Not on file  ?Social Connections: Not on file  ?Intimate Partner Violence: Not on file  ? ? ?Outpatient Medications Prior to Visit  ?Medication Sig Dispense Refill  ? levothyroxine (SYNTHROID) 50 MCG  tablet Take 1 tablet (50 mcg total) by mouth daily. 90 tablet 0  ? oxybutynin (DITROPAN-XL) 10 MG 24 hr tablet Take 1 tablet (10 mg total) by mouth at bedtime. 30 tablet 3  ? PROZAC 20 MG capsule Take 20 mg by mouth daily.    ? SUMAtriptan (IMITREX) 50 MG tablet Take 1 tablet (50 mg total) by mouth once for 1 dose. May repeat in 2 hours if headache persists or recurs. 10 tablet 3  ? ?No facility-administered medications prior to visit.  ? ? ?Allergies  ?Allergen Reactions  ? Codeine   ?  constipation  ? ? ?ROS ?Review of Systems  ?Constitutional: Negative.   ?HENT: Negative.    ?Eyes: Negative.   ?Respiratory: Negative.    ?Cardiovascular: Negative.   ?Gastrointestinal: Negative.   ?Endocrine: Negative.   ?Genitourinary: Negative.   ?Musculoskeletal: Negative.   ?Skin: Negative.   ?Allergic/Immunologic: Negative.   ?Neurological: Negative.   ?Hematological: Negative.   ?Psychiatric/Behavioral: Negative.    ?All other systems reviewed and are negative. ? ?   ?Objective:  ?  ?Physical Exam ?Vitals and nursing note reviewed.  ?Constitutional:   ?   General: She is not in acute distress. ?   Appearance: Normal appearance. She is obese. She is not ill-appearing, toxic-appearing or diaphoretic.  ?Cardiovascular:  ?   Rate and Rhythm: Normal rate and regular rhythm.  ?   Heart sounds: Normal heart sounds. No murmur heard. ?  No friction rub. No gallop.  ?Pulmonary:  ?   Effort: Pulmonary effort is normal. No respiratory distress.  ?   Breath sounds: Normal breath sounds. No stridor. No wheezing, rhonchi or rales.  ?Chest:  ?   Chest wall: No tenderness.  ?Skin: ?   General: Skin is warm and dry.  ?Neurological:  ?   General: No focal deficit present.  ?   Mental Status: She is alert and oriented to person, place, and time. Mental status is at baseline.  ?Psychiatric:     ?   Mood and Affect: Mood normal.     ?   Behavior: Behavior normal.     ?   Thought Content: Thought content normal.     ?   Judgment: Judgment normal.  ? ? ?BP (!) 141/87   Pulse 69   Temp 98.2 ?F (36.8 ?C) (Temporal)   Resp 18   Ht '5\' 7"'$  (1.702 m)   Wt 214 lb (97.1 kg)   SpO2 100%   BMI 33.52 kg/m?  ?Wt Readings from Last 3 Encounters:  ?05/20/21 214 lb (97.1 kg)  ?04/10/21 211 lb 12.8 oz (96.1 kg)  ?01/07/19 182 lb 6 oz (82.7 kg)  ? ? ? ?Health Maintenance Due  ?Topic Date Due  ? HIV Screening  Never done  ? Hepatitis C Screening  Never done  ? COLONOSCOPY (Pts 45-68yr Insurance coverage will need to be confirmed)  02/03/2019  ? Zoster Vaccines- Shingrix (1 of 2) Never done  ? COVID-19 Vaccine (3 - Booster for Moderna series) 10/09/2019  ? ? ?There are no preventive care reminders to display for this patient. ? ?Lab Results  ?Component Value Date  ? TSH 2.59 05/15/2021  ? ?Lab Results  ?Component Value Date  ? WBC 6.8 04/10/2021  ? HGB 14.4 04/10/2021  ? HCT 43.3 04/10/2021  ? MCV 81.5 04/10/2021  ? PLT 238.0 04/10/2021  ? ?Lab Results  ?Component Value Date  ? NA 141 04/10/2021  ? K 4.3  04/10/2021  ?  CO2 29 04/10/2021  ? GLUCOSE 86 04/10/2021  ? BUN 15 04/10/2021  ? CREATININE 1.03 04/10/2021  ? BILITOT 0.6 04/10/2021  ? ALKPHOS 73 04/10/2021  ? AST 15 04/10/2021  ? ALT 12 04/10/2021  ? PROT 7.1 04/10/2021  ? ALBUMIN 4.4 04/10/2021  ? CALCIUM 9.4 04/10/2021  ? GFR 62.80 04/10/2021  ? ?Lab Results  ?Component Value Date  ? CHOL 233 (H) 04/10/2021  ? ?Lab Results  ?Component Value Date  ? HDL 57.10 04/10/2021  ? ?Lab Results  ?Component Value Date  ? LDLCALC 154 (H) 04/10/2021  ? ?Lab Results  ?Component Value Date  ? TRIG 109.0 04/10/2021  ? ?Lab Results  ?Component Value Date  ? CHOLHDL 4 04/10/2021  ? ?Lab Results  ?Component Value Date  ? HGBA1C 4.9 03/25/2016  ? ? ?  ?Assessment & Plan:  ? ?Problem List Items Addressed This Visit   ? ?  ? Nervous and Auditory  ? Cerebral palsy ([place]) - Primary  ? Relevant Orders  ? Ambulatory referral to Home Health  ? ?Other Visit Diagnoses   ? ? Limited mobility      ? Relevant Orders  ? Ambulatory referral to Home Health  ? At high risk for falls      ? Relevant Orders  ? Ambulatory referral to Home Health  ? ?  ? ? ?No orders of the defined types were placed in this encounter. ? ? ?Follow-up: Return if symptoms worsen or fail to improve.  ? ?PLAN ?Home health referral sent ?Discussed labs ?Return as scheduled with PCP ?Patient encouraged to call clinic with any questions, comments, or concerns. ? ?Maximiano Coss, NP ?

## 2021-05-29 DIAGNOSIS — F23 Brief psychotic disorder: Secondary | ICD-10-CM | POA: Diagnosis not present

## 2021-07-09 ENCOUNTER — Other Ambulatory Visit: Payer: Self-pay

## 2021-07-09 DIAGNOSIS — R7989 Other specified abnormal findings of blood chemistry: Secondary | ICD-10-CM

## 2021-07-09 MED ORDER — LEVOTHYROXINE SODIUM 50 MCG PO TABS: 50.0000 ug | ORAL_TABLET | Freq: Every day | ORAL | 0 refills | Status: DC

## 2021-08-22 ENCOUNTER — Other Ambulatory Visit: Payer: Self-pay

## 2021-08-22 DIAGNOSIS — N3946 Mixed incontinence: Secondary | ICD-10-CM

## 2021-08-22 MED ORDER — OXYBUTYNIN CHLORIDE ER 10 MG PO TB24: 10.0000 mg | ORAL_TABLET | Freq: Every day | ORAL | 3 refills | Status: DC

## 2021-08-28 DIAGNOSIS — F23 Brief psychotic disorder: Secondary | ICD-10-CM | POA: Diagnosis not present

## 2021-10-04 ENCOUNTER — Other Ambulatory Visit: Payer: Self-pay

## 2021-10-04 DIAGNOSIS — R7989 Other specified abnormal findings of blood chemistry: Secondary | ICD-10-CM

## 2021-10-04 MED ORDER — LEVOTHYROXINE SODIUM 50 MCG PO TABS: 50.0000 ug | ORAL_TABLET | Freq: Every day | ORAL | 0 refills | Status: DC

## 2021-10-09 ENCOUNTER — Encounter: Payer: PPO | Admitting: Family Medicine

## 2021-10-21 ENCOUNTER — Encounter: Payer: PPO | Admitting: Family Medicine

## 2021-10-23 ENCOUNTER — Ambulatory Visit (INDEPENDENT_AMBULATORY_CARE_PROVIDER_SITE_OTHER): Payer: PPO | Admitting: Family Medicine

## 2021-10-23 ENCOUNTER — Encounter: Payer: Self-pay | Admitting: Family Medicine

## 2021-10-23 VITALS — BP 128/72 | HR 76 | Temp 98.4°F | Resp 16 | Ht 67.0 in | Wt 211.2 lb

## 2021-10-23 DIAGNOSIS — Z23 Encounter for immunization: Secondary | ICD-10-CM

## 2021-10-23 DIAGNOSIS — Z0000 Encounter for general adult medical examination without abnormal findings: Secondary | ICD-10-CM | POA: Diagnosis not present

## 2021-10-23 DIAGNOSIS — R7989 Other specified abnormal findings of blood chemistry: Secondary | ICD-10-CM

## 2021-10-23 DIAGNOSIS — Z13220 Encounter for screening for lipoid disorders: Secondary | ICD-10-CM

## 2021-10-23 DIAGNOSIS — Z131 Encounter for screening for diabetes mellitus: Secondary | ICD-10-CM

## 2021-10-23 DIAGNOSIS — G809 Cerebral palsy, unspecified: Secondary | ICD-10-CM

## 2021-10-23 DIAGNOSIS — E669 Obesity, unspecified: Secondary | ICD-10-CM | POA: Diagnosis not present

## 2021-10-23 LAB — TSH: TSH: 1.2 u[IU]/mL (ref 0.35–5.50)

## 2021-10-23 LAB — COMPREHENSIVE METABOLIC PANEL
ALT: 13 U/L (ref 0–35)
AST: 15 U/L (ref 0–37)
Albumin: 4.3 g/dL (ref 3.5–5.2)
Alkaline Phosphatase: 58 U/L (ref 39–117)
BUN: 15 mg/dL (ref 6–23)
CO2: 25 mEq/L (ref 19–32)
Calcium: 9.2 mg/dL (ref 8.4–10.5)
Chloride: 104 mEq/L (ref 96–112)
Creatinine, Ser: 0.82 mg/dL (ref 0.40–1.20)
GFR: 82.25 mL/min (ref >60.00)
Glucose, Bld: 92 mg/dL (ref 70–99)
Potassium: 4.1 mEq/L (ref 3.5–5.1)
Sodium: 141 mEq/L (ref 135–145)
Total Bilirubin: 0.5 mg/dL (ref 0.2–1.2)
Total Protein: 7.1 g/dL (ref 6.0–8.3)

## 2021-10-23 LAB — HEMOGLOBIN A1C: Hgb A1c MFr Bld: 5.4 % (ref 4.6–6.5)

## 2021-10-23 LAB — LIPID PANEL
Cholesterol: 183 mg/dL (ref 0–200)
HDL: 47.4 mg/dL (ref >39.00)
LDL Cholesterol: 109 mg/dL — ABNORMAL HIGH (ref 0–99)
NonHDL: 135.28
Total CHOL/HDL Ratio: 4
Triglycerides: 130 mg/dL (ref 0.0–149.0)
VLDL: 26 mg/dL (ref 0.0–40.0)

## 2021-10-23 LAB — VITAMIN D 25 HYDROXY (VIT D DEFICIENCY, FRACTURES): VITD: 21.66 ng/mL — ABNORMAL LOW (ref 30.00–100.00)

## 2021-10-23 MED ORDER — LEVOTHYROXINE SODIUM 50 MCG PO TABS: 50.0000 ug | ORAL_TABLET | Freq: Every day | ORAL | 1 refills | Status: DC

## 2021-10-23 NOTE — Assessment & Plan Note (Signed)
Ongoing issue for pt.  BMI 33.  Encouraged low carb diet and regular exercise.  Check labs to risk stratify.  Will follow.

## 2021-10-23 NOTE — Assessment & Plan Note (Signed)
Chronic problem.  Since birth.  Walking is worsening and pt never set up anything w/ HH PT as recommended at last visit.  Will re-refer as pt needs evaluation for a power scooter.

## 2021-10-23 NOTE — Progress Notes (Signed)
   Subjective:    Patient ID: Holly Saunders, female    DOB: 27-Mar-1970, 51 y.o.   MRN: [id number]  HPI CPE- UTD on mammo, Tdap.  Due for colonoscopy- mom and pt would like to postpone due to inability to tolerate prep  Patient Care Team    Relationship Specialty Notifications Start End  Midge Minium, MD PCP - General Family Medicine  06/18/11    Comment: Nestor Ramp, Docia Chuck, MD Consulting Physician Gastroenterology  05/05/18     Health Maintenance  Topic Date Due   INFLUENZA VACCINE  10/01/2021   COVID-19 Vaccine (3 - Moderna series) 11/08/2021 (Originally 10/09/2019)   Zoster Vaccines- Shingrix (1 of 2) 01/23/2022 (Originally 05/29/2019)   COLONOSCOPY (Pts 45-46yr Insurance coverage will need to be confirmed)  10/24/2022 (Originally 02/03/2019)   Hepatitis C Screening  10/24/2022 (Originally 05/29/1987)   HIV Screening  10/24/2022 (Originally 05/28/1984)   MAMMOGRAM  04/25/2023   TETANUS/TDAP  03/24/2027   HPV VACCINES  Aged Out      Review of Systems Patient reports no vision/ hearing changes, adenopathy,fever, weight change,  persistant/recurrent hoarseness , swallowing issues, chest pain, palpitations, edema, persistant/recurrent cough, hemoptysis, dyspnea (rest/exertional/paroxysmal nocturnal), gastrointestinal bleeding (melena, rectal bleeding), abdominal pain, significant heartburn, bowel changes, GU symptoms (dysuria, hematuria, incontinence), Gyn symptoms (abnormal  bleeding, pain),  syncope, focal weakness, memory loss, numbness & tingling, skin/hair/nail changes, abnormal bruising or bleeding, anxiety, or depression.     Objective:   Physical Exam General Appearance:    Alert, cooperative, no distress, appears stated age, obese  Head:    Normocephalic, without obvious abnormality, atraumatic  Eyes:    PERRL, conjunctiva/corneas clear, EOM's intact both eyes  Ears:    Normal TM's and external ear canals, both ears  Nose:   Nares normal, septum midline, mucosa normal,  no drainage    or sinus tenderness  Throat:   Lips, mucosa, and tongue normal; teeth and gums normal  Neck:   Supple, symmetrical, trachea midline, no adenopathy;    Thyroid: no enlargement/tenderness/nodules  Back:     Symmetric, no curvature, ROM normal, no CVA tenderness  Lungs:     Clear to auscultation bilaterally, respirations unlabored  Chest Wall:    No tenderness or deformity   Heart:    Regular rate and rhythm, S1 and S2 normal, no murmur, rub   or gallop  Breast Exam:    Deferred to mammo  Abdomen:     Soft, non-tender, bowel sounds active all four quadrants,    no masses, no organomegaly  Genitalia:    Deferred  Rectal:    Extremities:   Extremities normal, atraumatic, no cyanosis or edema  Pulses:   2+ and symmetric all extremities  Skin:   Skin color, texture, turgor normal, no rashes or lesions  Lymph nodes:   Cervical, supraclavicular, and axillary nodes normal  Neurologic:   CNII-XII intact, normal strength, sensation and reflexes    throughout          Assessment & Plan:

## 2021-10-23 NOTE — Patient Instructions (Signed)
Follow up in 1 year or as needed We'll notify you of your lab results and make any changes if needed Keep up the good work on healthy diet and regular exercise- you're doing great!! Call with any questions or concerns GOOD LUCK WITH SWIMMING!!!

## 2021-10-23 NOTE — Assessment & Plan Note (Signed)
Pt's PE unchanged from previous.  UTD on mammo and Tdap.  Overdue for colonoscopy but given her mobility issues, she has trouble w/ the prep (she can't make it to the bathroom).  Encouraged them to call GI to discuss possible options.  Check labs.  Anticipatory guidance provided.

## 2021-10-24 ENCOUNTER — Other Ambulatory Visit: Payer: Self-pay

## 2021-10-24 MED ORDER — VITAMIN D (ERGOCALCIFEROL) 1.25 MG (50000 UNIT) PO CAPS: 50000.0000 [IU] | ORAL_CAPSULE | ORAL | 12 refills | Status: DC

## 2021-10-24 NOTE — Progress Notes (Signed)
Informed pt of lab results and Vitamin D has been sent in to pharmacy

## 2021-11-19 ENCOUNTER — Telehealth: Payer: Self-pay | Admitting: Family Medicine

## 2021-11-19 NOTE — Telephone Encounter (Signed)
Left message for patient to call back and schedule Medicare Annual Wellness Visit (AWV).   Please offer to do virtually or by telephone.  Left office number and my jabber [phone].  Last AWV:05/05/2018  Please schedule at anytime with Nurse Health Advisor.

## 2021-11-20 DIAGNOSIS — F23 Brief psychotic disorder: Secondary | ICD-10-CM | POA: Diagnosis not present

## 2022-01-01 ENCOUNTER — Telehealth: Payer: Self-pay

## 2022-01-01 NOTE — Telephone Encounter (Signed)
Called patient x 3 with no answer, patient may reschedule for the next available appointment .  L.Wilson,LPN

## 2022-01-13 DIAGNOSIS — G801 Spastic diplegic cerebral palsy: Secondary | ICD-10-CM | POA: Diagnosis not present

## 2022-01-13 DIAGNOSIS — M19072 Primary osteoarthritis, left ankle and foot: Secondary | ICD-10-CM | POA: Diagnosis not present

## 2022-01-13 DIAGNOSIS — M216X1 Other acquired deformities of right foot: Secondary | ICD-10-CM | POA: Diagnosis not present

## 2022-01-13 DIAGNOSIS — M216X2 Other acquired deformities of left foot: Secondary | ICD-10-CM | POA: Diagnosis not present

## 2022-01-13 DIAGNOSIS — Q66211 Congenital metatarsus primus varus, right foot: Secondary | ICD-10-CM | POA: Diagnosis not present

## 2022-01-13 DIAGNOSIS — Z882 Allergy status to sulfonamides status: Secondary | ICD-10-CM | POA: Diagnosis not present

## 2022-01-13 DIAGNOSIS — Q66212 Congenital metatarsus primus varus, left foot: Secondary | ICD-10-CM | POA: Diagnosis not present

## 2022-01-13 DIAGNOSIS — Z881 Allergy status to other antibiotic agents status: Secondary | ICD-10-CM | POA: Diagnosis not present

## 2022-01-13 DIAGNOSIS — Q6589 Other specified congenital deformities of hip: Secondary | ICD-10-CM | POA: Diagnosis not present

## 2022-01-13 DIAGNOSIS — Z885 Allergy status to narcotic agent status: Secondary | ICD-10-CM | POA: Diagnosis not present

## 2022-01-29 ENCOUNTER — Ambulatory Visit (INDEPENDENT_AMBULATORY_CARE_PROVIDER_SITE_OTHER): Payer: PPO | Admitting: *Deleted

## 2022-01-29 DIAGNOSIS — Z0000 Encounter for general adult medical examination without abnormal findings: Secondary | ICD-10-CM | POA: Diagnosis not present

## 2022-01-29 NOTE — Progress Notes (Signed)
Subjective:   Holly Saunders is a 51 y.o. female who presents for Medicare Annual (Subsequent) preventive examination.  I connected with  Holly Saunders on 01/29/22 by a telephone enabled telemedicine application and verified that I am speaking with the correct person using two identifiers.   I discussed the limitations of evaluation and management by telemedicine. The patient expressed understanding and agreed to proceed.  Patient location: home  Provider location: Tele-health-office    Review of Systems     Cardiac Risk Factors include: advanced age (>37mn, >>66women);obesity (BMI >30kg/m2)     Objective:    There were no vitals filed for this visit. There is no height or weight on file to calculate BMI.     01/29/2022    1:30 PM 01/29/2022    1:28 PM 05/05/2018    2:15 PM 04/27/2013    2:54 PM  Advanced Directives  Does Patient Have a Medical Advance Directive? No No No Patient does not have advance directive;Patient would like information  Would patient like information on creating a medical advance directive?  No - Patient declined Yes (MAU/Ambulatory/Procedural Areas - Information given) Advance directive packet given    Current Medications (verified) Outpatient Encounter Medications as of 01/29/2022  Medication Sig   levothyroxine (SYNTHROID) 50 MCG tablet Take 1 tablet (50 mcg total) by mouth daily.   oxybutynin (DITROPAN-XL) 10 MG 24 hr tablet Take 1 tablet (10 mg total) by mouth at bedtime.   PROZAC 20 MG capsule Take 20 mg by mouth daily.   Vitamin D, Ergocalciferol, (DRISDOL) 1.25 MG (50000 UNIT) CAPS capsule Take 1 capsule (50,000 Units total) by mouth every 7 (seven) days.   SUMAtriptan (IMITREX) 50 MG tablet Take 1 tablet (50 mg total) by mouth once for 1 dose. May repeat in 2 hours if headache persists or recurs.   No facility-administered encounter medications on file as of 01/29/2022.    Allergies (verified) Codeine   History: Past Medical  History:  Diagnosis Date   Allergy    Anxiety    Cardiac murmur    Cerebral palsy (HCC)    Cerebral palsy (HCC)    Colon polyps    Ear infection    recurrent   Headache(784.0)    migraines   Hyperlipidemia    Hypertension    not being treated for it at present time   PONV (postoperative nausea and vomiting)    Seasonal allergies    Past Surgical History:  Procedure Laterality Date   ABDOMINAL HYSTERECTOMY     CHOLECYSTECTOMY N/A 05/03/2013   Procedure: LAPAROSCOPIC CHOLECYSTECTOMY WITH INTRAOPERATIVE CHOLANGIOGRAM;  Surgeon: TMarcello MooresA. Cornett, MD;  [facility]  Service: General;  Laterality: N/A;   EYE SURGERY Bilateral    for cross eye   heel cord surgery Bilateral    HIP SURGERY     bialteral   INCISION AND DRAINAGE ABSCESS Right    axilla   LAPAROSCOPIC SUPRACERVICAL HYSTERECTOMY  01/31/09   TONSILLECTOMY     Family History  Problem Relation Age of Onset   Hypertension Mother    Hyperlipidemia Mother    Colon polyps Mother    Irritable bowel syndrome Mother    Ulcerative colitis Father    Colon cancer Father        not sure, he has a colostomy   Cancer Father        blood   Esophageal cancer Neg Hx    Rectal cancer Neg Hx    Stomach  cancer Neg Hx    Social History   Socioeconomic History   Marital status: Divorced    Spouse name: Not on file   Number of children: Not on file   Years of education: Not on file   Highest education level: Not on file  Occupational History   Not on file  Tobacco Use   Smoking status: Never   Smokeless tobacco: Never  Vaping Use   Vaping Use: Never used  Substance and Sexual Activity   Alcohol use: Yes    Comment: occasionally   Drug use: No   Sexual activity: Never  Other Topics Concern   Not on file  Social History Narrative   Not on file   Social Determinants of Health   Financial Resource Strain: Low Risk  (01/29/2022)   Overall Financial Resource Strain (CARDIA)    Difficulty of Paying Living Expenses:  Not hard at all  Food Insecurity: No Food Insecurity (01/29/2022)   Hunger Vital Sign    Worried About Running Out of Food in the Last Year: Never true    [place] in the Last Year: Never true  Transportation Needs: No Transportation Needs (01/29/2022)   PRAPARE - Hydrologist (Medical): No    Lack of Transportation (Non-Medical): No  Physical Activity: Inactive (01/29/2022)   Exercise Vital Sign    Days of Exercise per Week: 0 days    Minutes of Exercise per Session: 0 min  Stress: No Stress Concern Present (01/29/2022)   [place]    Feeling of Stress : Not at all  Social Connections: Unknown (01/29/2022)   Social Connection and Isolation Panel [NHANES]    Frequency of Communication with Friends and Family: More than three times a week    Frequency of Social Gatherings with Friends and Family: More than three times a week    Attends Religious Services: More than 4 times per year    Active Member of [employer] or Organizations: Yes    Attends Music therapist: More than 4 times per year    Marital Status: Not on file    Tobacco Counseling Counseling given: Not Answered   Clinical Intake:  Pre-visit preparation completed: Yes  Pain : No/denies pain     Diabetes: No  How often do you need to have someone help you when you read instructions, pamphlets, or other written materials from your doctor or pharmacy?: 1 - Never  Diabetic?  no  Interpreter Needed?: No  Information entered by :: Leroy Kennedy LPN   Activities of Daily Living    01/29/2022    1:30 PM  In your present state of health, do you have any difficulty performing the following activities:  Hearing? 0  Vision? 0  Difficulty concentrating or making decisions? 0  Walking or climbing stairs? 1  Dressing or bathing? 0  Doing errands, shopping? 0  Preparing Food and eating ? N  Using the Toilet?  N  In the past six months, have you accidently leaked urine? Y  Do you have problems with loss of bowel control? N  Managing your Medications? N  Managing your Finances? N  Housekeeping or managing your Housekeeping? N    Patient Care Team: Midge Minium, MD as PCP - General (Family Medicine) Irene Shipper, MD as Consulting Physician (Gastroenterology)  Indicate any recent Medical Services you may have received from other than [facility] providers in the  past year (date may be approximate).     Assessment:   This is a routine wellness examination for [place].  Hearing/Vision screen Hearing Screening - Comments:: No trouble hearing Vision Screening - Comments:: Not up to date Unsure of name  Dietary issues and exercise activities discussed: Current Exercise Habits: The patient does not participate in regular exercise at present   Goals Addressed             This Visit's Progress    Weight (lb) < 200 lb (90.7 kg)       Increase excercise       Depression Screen    01/29/2022    1:37 PM 10/23/2021    2:04 PM 05/20/2021    2:05 PM 04/10/2021    1:54 PM 01/07/2019    1:33 PM 05/05/2018    2:16 PM 11/04/2017    9:03 AM  PHQ 2/9 Scores  PHQ - 2 Score 0 0 0 0 0 0 0  PHQ- 9 Score 0 0 0 0 0 0 0    Fall Risk    01/29/2022    1:27 PM 10/23/2021    2:03 PM 04/10/2021    1:55 PM 01/07/2019    1:32 PM 05/05/2018    2:16 PM  [place] in the past year? 0 0 1  1  Comment     Balance/gait issue (CP)  Number falls in past yr: 0 0  1 1  Injury with Fall? 0 0  0 1  Risk for fall due to : Impaired balance/gait No Fall Risks History of fall(s) Impaired mobility;Impaired vision;Impaired balance/gait Impaired mobility;Impaired balance/gait;History of fall(s)  Follow up Falls evaluation completed;Education provided;Falls prevention discussed Falls evaluation completed Falls evaluation completed Falls evaluation completed Falls prevention discussed    FALL RISK PREVENTION PERTAINING  TO THE HOME:  Any stairs in or around the home? No  If so, are there any without handrails? No  Home free of loose throw rugs in walkways, pet beds, electrical cords, etc? Yes  Adequate lighting in your home to reduce risk of falls? Yes   ASSISTIVE DEVICES UTILIZED TO PREVENT FALLS:  Life alert? No  Use of a cane, walker or w/c? Yes  Grab bars in the bathroom? No  Shower chair or bench in shower? Yes  Elevated toilet seat or a handicapped toilet? No   TIMED UP AND GO:  Was the test performed? No .    Cognitive Function:    05/05/2018    2:22 PM  MMSE - Mini Mental State Exam  Orientation to time 5  Orientation to Place 5  Registration 3  Attention/ Calculation 5  Recall 2  Language- name 2 objects 2  Language- repeat 1  Language- follow 3 step command 3  Language- read & follow direction 1  Write a sentence 1  Copy design 1  Total score 29        01/29/2022    1:33 PM  6CIT Screen  What month? 0 points  What time? 0 points  Count back from 20 0 points  Months in reverse 2 points  Repeat phrase 2 points    Immunizations Immunization History  Administered Date(s) Administered   Influenza Whole 12/03/2007, 12/01/2008   Influenza,inj,Quad PF,6+ Mos 12/19/2013, 03/25/2016, 05/11/2017, 11/04/2017, 01/07/2019, 04/10/2021, 10/23/2021   Moderna Sars-Covid-2 Vaccination 07/17/2019, 08/14/2019   Td 10/27/2012   Tdap 03/23/2017    TDAP status: Up to date  Flu Vaccine status: Declined, Education has  been provided regarding the importance of this vaccine but patient still declined. Advised may receive this vaccine at local pharmacy or Health Dept. Aware to provide a copy of the vaccination record if obtained from local pharmacy or Health Dept. Verbalized acceptance and understanding.      Qualifies for Shingles Vaccine? Yes   Zostavax completed No   Shingrix Completed?: No.    Education has been provided regarding the importance of this vaccine. Patient has been  advised to call insurance company to determine out of pocket expense if they have not yet received this vaccine. Advised may also receive vaccine at local pharmacy or Health Dept. Verbalized acceptance and understanding.  Screening Tests Health Maintenance  Topic Date Due   Zoster Vaccines- Shingrix (1 of 2) Never done   COVID-19 Vaccine (3 - 2023-24 season) 11/01/2021   COLONOSCOPY (Pts 45-53yr Insurance coverage will need to be confirmed)  10/24/2022 (Originally 02/03/2019)   Hepatitis C Screening  10/24/2022 (Originally 05/29/1987)   HIV Screening  10/24/2022 (Originally 05/28/1984)   MAMMOGRAM  04/24/2022   Medicare Annual Wellness (AWV)  01/30/2023   INFLUENZA VACCINE  Completed   HPV VACCINES  Aged Out    Health Maintenance  Health Maintenance Due  Topic Date Due   Zoster Vaccines- Shingrix (1 of 2) Never done   COVID-19 Vaccine (3 - 2023-24 season) 11/01/2021    Colonoscopy discussed with MD with postpone   Mammogram status: Completed 2023. Repeat every year    Lung Cancer Screening: (Low Dose CT Chest recommended if Age 52-80years, 30 pack-year currently smoking OR have quit w/in 15years.) does not qualify.   Lung Cancer Screening Referral:   Additional Screening:  Hepatitis C Screening:  never done  Vision Screening: Recommended annual ophthalmology exams for early detection of glaucoma and other disorders of the eye. Is the patient up to date with their annual eye exam?  No  Who is the provider or what is the name of the office in which the patient attends annual eye exams?  If pt is not established with a provider, would they like to be referred to a provider to establish care? No .   Dental Screening: Recommended annual dental exams for proper oral hygiene  Community Resource Referral / Chronic Care Management: CRR required this visit?  No   CCM required this visit?  No      Plan:     I have personally reviewed and noted the following in the patient's  chart:   Medical and social history Use of alcohol, tobacco or illicit drugs  Current medications and supplements including opioid prescriptions. Patient is not currently taking opioid prescriptions. Functional ability and status Nutritional status Physical activity Advanced directives List of other physicians Hospitalizations, surgeries, and ER visits in previous 12 months Vitals Screenings to include cognitive, depression, and falls Referrals and appointments  In addition, I have reviewed and discussed with patient certain preventive protocols, quality metrics, and best practice recommendations. A written personalized care plan for preventive services as well as general preventive health recommendations were provided to patient.     JAjooni Karam LPN   [id number]  Nurse Notes:

## 2022-01-29 NOTE — Patient Instructions (Signed)
Holly Saunders , Thank you for taking time to come for your Medicare Wellness Visit. I appreciate your ongoing commitment to your health goals. Please review the following plan we discussed and let me know if I can assist you in the future.   These are the goals we discussed:  Goals      Weight (lb) < 195 lb (88.5 kg)     Lose weight by continuing Nutrisystem.      Weight (lb) < 200 lb (90.7 kg)     Increase excercise        This is a list of the screening recommended for you and due dates:  Health Maintenance  Topic Date Due   COVID-19 Vaccine (3 - 2023-24 season) 11/01/2021   Zoster (Shingles) Vaccine (1 of 2) 05/01/2022*   Colon Cancer Screening  10/24/2022*   Hepatitis C Screening: USPSTF Recommendation to screen - Ages 18-79 yo.  10/24/2022*   HIV Screening  10/24/2022*   Mammogram  04/24/2022   Medicare Annual Wellness Visit  01/30/2023   Flu Shot  Completed   HPV Vaccine  Aged Out  *Topic was postponed. The date shown is not the original due date.    Advanced directives: Education provided   Preventive Care 40-64 Years, Female Preventive care refers to lifestyle choices and visits with your health care provider that can promote health and wellness. What does preventive care include? A yearly physical exam. This is also called an annual well check. Dental exams once or twice a year. Routine eye exams. Ask your health care provider how often you should have your eyes checked. Personal lifestyle choices, including: Daily care of your teeth and gums. Regular physical activity. Eating a healthy diet. Avoiding tobacco and drug use. Limiting alcohol use. Practicing safe sex. Taking low-dose aspirin daily starting at age 69. Taking vitamin and mineral supplements as recommended by your health care provider. What happens during an annual well check? The services and screenings done by your health care provider during your annual well check will depend on your age, overall  health, lifestyle risk factors, and family history of disease. Counseling  Your health care provider may ask you questions about your: Alcohol use. Tobacco use. Drug use. Emotional well-being. Home and relationship well-being. Sexual activity. Eating habits. Work and work Statistician. Method of birth control. Menstrual cycle. Pregnancy history. Screening  You may have the following tests or measurements: Height, weight, and BMI. Blood pressure. Lipid and cholesterol levels. These may be checked every 5 years, or more frequently if you are over 49 years old. Skin check. Lung cancer screening. You may have this screening every year starting at age 72 if you have a 30-pack-year history of smoking and currently smoke or have quit within the past 15 years. Fecal occult blood test (FOBT) of the stool. You may have this test every year starting at age 78. Flexible sigmoidoscopy or colonoscopy. You may have a sigmoidoscopy every 5 years or a colonoscopy every 10 years starting at age 39. Hepatitis C blood test. Hepatitis B blood test. Sexually transmitted disease (STD) testing. Diabetes screening. This is done by checking your blood sugar (glucose) after you have not eaten for a while (fasting). You may have this done every 1-3 years. Mammogram. This may be done every 1-2 years. Talk to your health care provider about when you should start having regular mammograms. This may depend on whether you have a family history of breast cancer. BRCA-related cancer screening. This may be done  if you have a family history of breast, ovarian, tubal, or peritoneal cancers. Pelvic exam and Pap test. This may be done every 3 years starting at age 80. Starting at age 45, this may be done every 5 years if you have a Pap test in combination with an HPV test. Bone density scan. This is done to screen for osteoporosis. You may have this scan if you are at high risk for osteoporosis. Discuss your test results,  treatment options, and if necessary, the need for more tests with your health care provider. Vaccines  Your health care provider may recommend certain vaccines, such as: Influenza vaccine. This is recommended every year. Tetanus, diphtheria, and acellular pertussis (Tdap, Td) vaccine. You may need a Td booster every 10 years. Zoster vaccine. You may need this after age 97. Pneumococcal 13-valent conjugate (PCV13) vaccine. You may need this if you have certain conditions and were not previously vaccinated. Pneumococcal polysaccharide (PPSV23) vaccine. You may need one or two doses if you smoke cigarettes or if you have certain conditions. Talk to your health care provider about which screenings and vaccines you need and how often you need them. This information is not intended to replace advice given to you by your health care provider. Make sure you discuss any questions you have with your health care provider. Document Released: 03/16/2015 Document Revised: 11/07/2015 Document Reviewed: 12/19/2014 Elsevier Interactive Patient Education  2017 [employer] Prevention in the Home Falls can cause injuries. They can happen to people of all ages. There are many things you can do to make your home safe and to help prevent falls. What can I do on the outside of my home? Regularly fix the edges of walkways and driveways and fix any cracks. Remove anything that might make you trip as you walk through a door, such as a raised step or threshold. Trim any bushes or trees on the path to your home. Use bright outdoor lighting. Clear any walking paths of anything that might make someone trip, such as rocks or tools. Regularly check to see if handrails are loose or broken. Make sure that both sides of any steps have handrails. Any raised decks and porches should have guardrails on the edges. Have any leaves, snow, or ice cleared regularly. Use sand or salt on walking paths during winter. Clean  up any spills in your garage right away. This includes oil or grease spills. What can I do in the bathroom? Use night lights. Install grab bars by the toilet and in the tub and shower. Do not use towel bars as grab bars. Use non-skid mats or decals in the tub or shower. If you need to sit down in the shower, use a plastic, non-slip stool. Keep the floor dry. Clean up any water that spills on the floor as soon as it happens. Remove soap buildup in the tub or shower regularly. Attach bath mats securely with double-sided non-slip rug tape. Do not have throw rugs and other things on the floor that can make you trip. What can I do in the bedroom? Use night lights. Make sure that you have a light by your bed that is easy to reach. Do not use any sheets or blankets that are too big for your bed. They should not hang down onto the floor. Have a firm chair that has side arms. You can use this for support while you get dressed. Do not have throw rugs and other things on the  floor that can make you trip. What can I do in the kitchen? Clean up any spills right away. Avoid walking on wet floors. Keep items that you use a lot in easy-to-reach places. If you need to reach something above you, use a strong step stool that has a grab bar. Keep electrical cords out of the way. Do not use floor polish or wax that makes floors slippery. If you must use wax, use non-skid floor wax. Do not have throw rugs and other things on the floor that can make you trip. What can I do with my stairs? Do not leave any items on the stairs. Make sure that there are handrails on both sides of the stairs and use them. Fix handrails that are broken or loose. Make sure that handrails are as long as the stairways. Check any carpeting to make sure that it is firmly attached to the stairs. Fix any carpet that is loose or worn. Avoid having throw rugs at the top or bottom of the stairs. If you do have throw rugs, attach them to the  floor with carpet tape. Make sure that you have a light switch at the top of the stairs and the bottom of the stairs. If you do not have them, ask someone to add them for you. What else can I do to help prevent falls? Wear shoes that: Do not have high heels. Have rubber bottoms. Are comfortable and fit you well. Are closed at the toe. Do not wear sandals. If you use a stepladder: Make sure that it is fully opened. Do not climb a closed stepladder. Make sure that both sides of the stepladder are locked into place. Ask someone to hold it for you, if possible. Clearly mark and make sure that you can see: Any grab bars or handrails. First and last steps. Where the edge of each step is. Use tools that help you move around (mobility aids) if they are needed. These include: Canes. Walkers. Scooters. Crutches. Turn on the lights when you go into a dark area. Replace any light bulbs as soon as they burn out. Set up your furniture so you have a clear path. Avoid moving your furniture around. If any of your floors are uneven, fix them. If there are any pets around you, be aware of where they are. Review your medicines with your doctor. Some medicines can make you feel dizzy. This can increase your chance of falling. Ask your doctor what other things that you can do to help prevent falls. This information is not intended to replace advice given to you by your health care provider. Make sure you discuss any questions you have with your health care provider. Document Released: 12/14/2008 Document Revised: 07/26/2015 Document Reviewed: 03/24/2014 Elsevier Interactive Patient Education  2017 [employer].

## 2022-02-07 DIAGNOSIS — G801 Spastic diplegic cerebral palsy: Secondary | ICD-10-CM | POA: Diagnosis not present

## 2022-02-10 DIAGNOSIS — Z7409 Other reduced mobility: Secondary | ICD-10-CM | POA: Diagnosis not present

## 2022-02-10 DIAGNOSIS — G801 Spastic diplegic cerebral palsy: Secondary | ICD-10-CM | POA: Diagnosis not present

## 2022-02-12 ENCOUNTER — Other Ambulatory Visit: Payer: Self-pay

## 2022-02-12 DIAGNOSIS — N3946 Mixed incontinence: Secondary | ICD-10-CM

## 2022-02-12 MED ORDER — OXYBUTYNIN CHLORIDE ER 10 MG PO TB24: 10.0000 mg | ORAL_TABLET | Freq: Every day | ORAL | 3 refills | Status: DC

## 2022-02-19 DIAGNOSIS — F23 Brief psychotic disorder: Secondary | ICD-10-CM | POA: Diagnosis not present

## 2022-03-12 DIAGNOSIS — G801 Spastic diplegic cerebral palsy: Secondary | ICD-10-CM | POA: Diagnosis not present

## 2022-04-04 DIAGNOSIS — G801 Spastic diplegic cerebral palsy: Secondary | ICD-10-CM | POA: Diagnosis not present

## 2022-04-04 DIAGNOSIS — Z7409 Other reduced mobility: Secondary | ICD-10-CM | POA: Diagnosis not present

## 2022-05-21 DIAGNOSIS — F23 Brief psychotic disorder: Secondary | ICD-10-CM | POA: Diagnosis not present

## 2022-08-04 ENCOUNTER — Other Ambulatory Visit: Payer: Self-pay

## 2022-08-04 ENCOUNTER — Telehealth: Payer: Self-pay | Admitting: Family Medicine

## 2022-08-04 DIAGNOSIS — R7989 Other specified abnormal findings of blood chemistry: Secondary | ICD-10-CM

## 2022-08-04 MED ORDER — LEVOTHYROXINE SODIUM 50 MCG PO TABS: 50.0000 ug | ORAL_TABLET | Freq: Every day | ORAL | 1 refills | Status: DC

## 2022-08-04 NOTE — Telephone Encounter (Signed)
Refill sent to pharmacy.   

## 2022-08-04 NOTE — Telephone Encounter (Signed)
Encourage patient to contact the pharmacy for refills or they can request refills through [facility]  WHAT PHARMACY WOULD THEY LIKE THIS SENT TO:  [id number] Lorenza Evangelist, [place] - [address] AT [facility] OF [address]  MEDICATION NAME & DOSE: levothyroxine (SYNTHROID) 50 MCG tablet   NOTES/COMMENTS FROM PATIENT: Pt is out completely out of meds     Front office please notify patient: It takes 48-72 hours to process rx refill requests Ask patient to call pharmacy to ensure rx is ready before heading there.

## 2022-08-20 DIAGNOSIS — F322 Major depressive disorder, single episode, severe without psychotic features: Secondary | ICD-10-CM | POA: Diagnosis not present

## 2022-09-03 ENCOUNTER — Other Ambulatory Visit: Payer: Self-pay | Admitting: Family Medicine

## 2022-09-22 ENCOUNTER — Telehealth: Payer: Self-pay

## 2022-09-22 NOTE — Telephone Encounter (Signed)
LM for patient to schedule follow up appointment with office for chronic care.  Elijio Miles, [facility] Health Specialist

## 2022-09-24 NOTE — Telephone Encounter (Signed)
Reached out to patient to set up follow up visit with provider to discuss chronic conditions.  Telephone encounter attempt : 2   A HIPAA compliant voice message was left requesting a return call.  Instructed patient to call office or to call me at [phone].  Elijio Miles [facility] Health Specialist

## 2022-10-08 ENCOUNTER — Other Ambulatory Visit: Payer: Self-pay | Admitting: Family Medicine

## 2022-10-08 DIAGNOSIS — N3946 Mixed incontinence: Secondary | ICD-10-CM

## 2022-10-22 ENCOUNTER — Encounter: Payer: PPO | Admitting: Family Medicine

## 2022-10-29 ENCOUNTER — Other Ambulatory Visit: Payer: Self-pay | Admitting: Family Medicine

## 2022-10-29 DIAGNOSIS — R7989 Other specified abnormal findings of blood chemistry: Secondary | ICD-10-CM

## 2022-10-31 ENCOUNTER — Ambulatory Visit (INDEPENDENT_AMBULATORY_CARE_PROVIDER_SITE_OTHER): Payer: PPO | Admitting: Family Medicine

## 2022-10-31 ENCOUNTER — Encounter: Payer: Self-pay | Admitting: Family Medicine

## 2022-10-31 VITALS — BP 118/78 | HR 67 | Temp 98.1°F | Resp 18 | Ht 67.5 in | Wt 213.0 lb

## 2022-10-31 DIAGNOSIS — Z0000 Encounter for general adult medical examination without abnormal findings: Secondary | ICD-10-CM

## 2022-10-31 DIAGNOSIS — G809 Cerebral palsy, unspecified: Secondary | ICD-10-CM | POA: Diagnosis not present

## 2022-10-31 DIAGNOSIS — Z23 Encounter for immunization: Secondary | ICD-10-CM

## 2022-10-31 DIAGNOSIS — F3341 Major depressive disorder, recurrent, in partial remission: Secondary | ICD-10-CM | POA: Diagnosis not present

## 2022-10-31 DIAGNOSIS — Z1211 Encounter for screening for malignant neoplasm of colon: Secondary | ICD-10-CM | POA: Diagnosis not present

## 2022-10-31 DIAGNOSIS — E559 Vitamin D deficiency, unspecified: Secondary | ICD-10-CM

## 2022-10-31 DIAGNOSIS — E669 Obesity, unspecified: Secondary | ICD-10-CM | POA: Diagnosis not present

## 2022-10-31 LAB — CBC WITH DIFFERENTIAL/PLATELET
Basophils Absolute: 0.1 K/uL (ref 0.0–0.1)
Basophils Relative: 1.4 % (ref 0.0–3.0)
Eosinophils Absolute: 0.1 K/uL (ref 0.0–0.7)
Eosinophils Relative: 1.8 % (ref 0.0–5.0)
HCT: 44 % (ref 36.0–46.0)
Hemoglobin: 14.5 g/dL (ref 12.0–15.0)
Lymphocytes Relative: 24.8 % (ref 12.0–46.0)
Lymphs Abs: 1.8 K/uL (ref 0.7–4.0)
MCHC: 33 g/dL (ref 30.0–36.0)
MCV: 83.6 fl (ref 78.0–100.0)
Monocytes Absolute: 0.4 K/uL (ref 0.1–1.0)
Monocytes Relative: 5.9 % (ref 3.0–12.0)
Neutro Abs: 4.8 K/uL (ref 1.4–7.7)
Neutrophils Relative %: 66.1 % (ref 43.0–77.0)
Platelets: 241 K/uL (ref 150.0–400.0)
RBC: 5.26 Mil/uL — ABNORMAL HIGH (ref 3.87–5.11)
RDW: 12.8 % (ref 11.5–15.5)
WBC: 7.3 K/uL (ref 4.0–10.5)

## 2022-10-31 LAB — BASIC METABOLIC PANEL WITH GFR
BUN: 14 mg/dL (ref 6–23)
CO2: 28 meq/L (ref 19–32)
Calcium: 9.4 mg/dL (ref 8.4–10.5)
Chloride: 105 meq/L (ref 96–112)
Creatinine, Ser: 0.93 mg/dL (ref 0.40–1.20)
GFR: 70.21 mL/min
Glucose, Bld: 80 mg/dL (ref 70–99)
Potassium: 4.4 meq/L (ref 3.5–5.1)
Sodium: 141 meq/L (ref 135–145)

## 2022-10-31 LAB — VITAMIN D 25 HYDROXY (VIT D DEFICIENCY, FRACTURES): VITD: 23.95 ng/mL — ABNORMAL LOW (ref 30.00–100.00)

## 2022-10-31 LAB — HEPATIC FUNCTION PANEL
ALT: 12 U/L (ref 0–35)
AST: 13 U/L (ref 0–37)
Albumin: 4.3 g/dL (ref 3.5–5.2)
Alkaline Phosphatase: 75 U/L (ref 39–117)
Bilirubin, Direct: 0.1 mg/dL (ref 0.0–0.3)
Total Bilirubin: 0.6 mg/dL (ref 0.2–1.2)
Total Protein: 6.9 g/dL (ref 6.0–8.3)

## 2022-10-31 LAB — LIPID PANEL
Cholesterol: 217 mg/dL — ABNORMAL HIGH (ref 0–200)
HDL: 49.7 mg/dL (ref >39.00)
LDL Cholesterol: 134 mg/dL — ABNORMAL HIGH (ref 0–99)
NonHDL: 167.69
Total CHOL/HDL Ratio: 4
Triglycerides: 166 mg/dL — ABNORMAL HIGH (ref 0.0–149.0)
VLDL: 33.2 mg/dL (ref 0.0–40.0)

## 2022-10-31 LAB — TSH: TSH: 2.9 u[IU]/mL (ref 0.35–5.50)

## 2022-10-31 NOTE — Assessment & Plan Note (Signed)
Ongoing issue.  Present since birth

## 2022-10-31 NOTE — Assessment & Plan Note (Signed)
Currently doing well.  No changes at this time

## 2022-10-31 NOTE — Assessment & Plan Note (Signed)
Pt's PE unchanged from previous.  Plans to schedule mammo.  Overdue for colonoscopy but family is not sure how to proceed.  She is less mobile than she was in 2015 due to CP contractures and she is not able to make it to the bathroom for the prep.  She had an adenomatous polyp in 2015 and a possible hx of colon cancer in dad- which would likely r/o use of Cologuard.  Will refer to GI for them to evaluate and determine the next steps.  Check labs.  Flu shot given.  Anticipatory guidance provided.

## 2022-10-31 NOTE — Progress Notes (Signed)
   Subjective:    Patient ID: Holly Saunders, female    DOB: 1970-06-01, 52 y.o.   MRN: [id number]  HPI CPE- UTD on Tdap.  Due for colonoscopy, mammo.  Pt has difficulty w/ colonoscopy b/c with her cerebral palsy she is not able to make it to the bathroom.    Patient Care Team    Relationship Specialty Notifications Start End  Sheliah Hatch, MD PCP - General Family Medicine  06/18/11    Comment: Katherene Ponto, Wilhemina Bonito, MD Consulting Physician Gastroenterology  05/05/18     Health Maintenance  Topic Date Due   HIV Screening  Never done   Hepatitis C Screening  Never done   Colonoscopy  02/03/2019   Zoster Vaccines- Shingrix (1 of 2) Never done   COVID-19 Vaccine (3 - 2023-24 season) 11/01/2021   MAMMOGRAM  04/24/2022   INFLUENZA VACCINE  10/02/2022   Medicare Annual Wellness (AWV)  01/30/2023   DTaP/Tdap/Td (3 - Td or Tdap) 03/24/2027   HPV VACCINES  Aged Out      Review of Systems Patient reports no vision/ hearing changes, adenopathy,fever, weight change,  persistant/recurrent hoarseness , swallowing issues, chest pain, palpitations, edema, persistant/recurrent cough, hemoptysis, dyspnea (rest/exertional/paroxysmal nocturnal), gastrointestinal bleeding (melena, rectal bleeding), abdominal pain, significant heartburn, bowel changes, GU symptoms (dysuria, hematuria, incontinence), Gyn symptoms (abnormal  bleeding, pain),  syncope, focal weakness, memory loss, numbness & tingling, skin/hair/nail changes, abnormal bruising or bleeding, anxiety, or depression.     Objective:   Physical Exam General Appearance:    Alert, cooperative, no distress, appears stated age, obese  Head:    Normocephalic, without obvious abnormality, atraumatic  Eyes:    PERRL, conjunctiva/corneas clear, EOM's intact both eyes  Ears:    Normal TM's and external ear canals, both ears  Nose:   Nares normal, septum midline, mucosa normal, no drainage    or sinus tenderness  Throat:   Lips, mucosa, and tongue  normal; teeth and gums normal  Neck:   Supple, symmetrical, trachea midline, no adenopathy;    Thyroid: no enlargement/tenderness/nodules  Back:     Symmetric, no curvature, ROM normal, no CVA tenderness  Lungs:     Clear to auscultation bilaterally, respirations unlabored  Chest Wall:    No tenderness or deformity   Heart:    Regular rate and rhythm, S1 and S2 normal, no murmur, rub   or gallop  Breast Exam:    Deferred to mammo  Abdomen:     Soft, non-tender, bowel sounds active all four quadrants,    no masses, no organomegaly  Genitalia:    Deferred  Rectal:    Extremities:   Extremities normal, atraumatic, no cyanosis or edema  Pulses:   2+ and symmetric all extremities  Skin:   Skin color, texture, turgor normal, no rashes or lesions  Lymph nodes:   Cervical, supraclavicular, and axillary nodes normal  Neurologic:   CNII-XII intact, normal strength, sensation          Assessment & Plan:

## 2022-10-31 NOTE — Patient Instructions (Signed)
Follow up in 1 year or as needed We'll notify you of your lab results and make any changes if needed Continue to work on healthy diet and regular exercise- you can do it! Call and schedule your mammogram We'll call you to schedule w/ GI in [facility] Call with any questions or concerns Stay Safe!  Stay Healthy! Enjoy Fall!!!

## 2022-10-31 NOTE — Assessment & Plan Note (Signed)
Ongoing issue for pt.  Encouraged low carb, low sugar diet and physical activity as she is able.  She is limited due to her CP, but encouraged her to walk.  Check labs to risk stratify.  Will follow.

## 2022-10-31 NOTE — Assessment & Plan Note (Signed)
Check labs and replete prn. 

## 2022-11-04 ENCOUNTER — Other Ambulatory Visit: Payer: Self-pay

## 2022-11-04 ENCOUNTER — Telehealth: Payer: Self-pay

## 2022-11-04 DIAGNOSIS — E559 Vitamin D deficiency, unspecified: Secondary | ICD-10-CM

## 2022-11-04 MED ORDER — VITAMIN D (ERGOCALCIFEROL) 1.25 MG (50000 UNIT) PO CAPS
50000.0000 [IU] | ORAL_CAPSULE | ORAL | 12 refills | Status: AC
Start: 2022-11-04 — End: ?

## 2022-11-04 NOTE — Addendum Note (Signed)
Addended byRockney Ghee on: 11/04/2022 08:24 AM   Modules accepted: Orders

## 2022-11-04 NOTE — Telephone Encounter (Signed)
Left pt Vm with lab results Vit D sent in

## 2022-11-06 NOTE — Telephone Encounter (Signed)
Explained results to pt mother

## 2022-11-24 DIAGNOSIS — F322 Major depressive disorder, single episode, severe without psychotic features: Secondary | ICD-10-CM | POA: Diagnosis not present

## 2023-02-18 DIAGNOSIS — F322 Major depressive disorder, single episode, severe without psychotic features: Secondary | ICD-10-CM | POA: Diagnosis not present

## 2023-03-03 ENCOUNTER — Other Ambulatory Visit: Payer: Self-pay | Admitting: Family Medicine

## 2023-03-03 DIAGNOSIS — N3946 Mixed incontinence: Secondary | ICD-10-CM

## 2023-04-25 ENCOUNTER — Other Ambulatory Visit: Payer: Self-pay | Admitting: Family Medicine

## 2023-04-25 DIAGNOSIS — R7989 Other specified abnormal findings of blood chemistry: Secondary | ICD-10-CM

## 2023-05-27 DIAGNOSIS — F322 Major depressive disorder, single episode, severe without psychotic features: Secondary | ICD-10-CM | POA: Diagnosis not present

## 2023-07-16 ENCOUNTER — Other Ambulatory Visit: Payer: Self-pay | Admitting: Family Medicine

## 2023-07-16 DIAGNOSIS — N3946 Mixed incontinence: Secondary | ICD-10-CM

## 2023-08-17 DIAGNOSIS — Z5181 Encounter for therapeutic drug level monitoring: Secondary | ICD-10-CM | POA: Diagnosis not present

## 2023-08-17 DIAGNOSIS — F322 Major depressive disorder, single episode, severe without psychotic features: Secondary | ICD-10-CM | POA: Diagnosis not present

## 2023-09-28 ENCOUNTER — Other Ambulatory Visit: Payer: Self-pay | Admitting: Family Medicine

## 2023-09-28 DIAGNOSIS — R7989 Other specified abnormal findings of blood chemistry: Secondary | ICD-10-CM

## 2023-10-12 ENCOUNTER — Telehealth: Payer: Self-pay | Admitting: Family Medicine

## 2023-10-12 NOTE — Telephone Encounter (Signed)
 Would you be willing to wrote for a new walker?

## 2023-10-12 NOTE — Telephone Encounter (Signed)
 I am happy to write a new prescription for a walker, but I need a bit more information to make sure it's what they want.  A rollator w/ a seat and hand brakes?  A plain silver rolling walker?  Any info would be helpful to make sure we write the correct thing

## 2023-10-12 NOTE — Telephone Encounter (Signed)
 Copied from CRM #[id number]. Topic: Clinical - Prescription Issue >> Oct 12, 2023 12:16 PM Carlyon D wrote:   Reason for CRM: Pt mother would like the provider Dr. Mahlon to write a script for her daughter for a new walker. Pt mother states Dr. Mahlon has done this before. Pts mother Sharman would like a call back in regards to the status of this.

## 2023-10-13 NOTE — Telephone Encounter (Signed)
 Called to discuss with Candance this morning, no answer, LM to call back so we can discuss the details of this request

## 2023-10-13 NOTE — Telephone Encounter (Signed)
 Called spoke with mother Candance, she asked for this to be a rollating walker with a seat and reports Tayllor should still be about 220lbs (213lb at August '24 appt) she states color is usually determined when they go to DME

## 2023-11-03 DIAGNOSIS — G801 Spastic diplegic cerebral palsy: Secondary | ICD-10-CM | POA: Insufficient documentation

## 2023-11-03 DIAGNOSIS — M19072 Primary osteoarthritis, left ankle and foot: Secondary | ICD-10-CM | POA: Insufficient documentation

## 2023-11-03 DIAGNOSIS — Q66211 Congenital metatarsus primus varus, right foot: Secondary | ICD-10-CM | POA: Insufficient documentation

## 2023-11-03 DIAGNOSIS — Q6589 Other specified congenital deformities of hip: Secondary | ICD-10-CM | POA: Insufficient documentation

## 2023-11-03 DIAGNOSIS — Q66212 Congenital metatarsus primus varus, left foot: Secondary | ICD-10-CM | POA: Insufficient documentation

## 2023-11-03 DIAGNOSIS — M216X9 Other acquired deformities of unspecified foot: Secondary | ICD-10-CM | POA: Insufficient documentation

## 2023-11-04 ENCOUNTER — Encounter: Payer: Self-pay | Admitting: Family Medicine

## 2023-11-04 ENCOUNTER — Ambulatory Visit: Payer: PPO | Admitting: Family Medicine

## 2023-11-04 VITALS — BP 126/84 | HR 65 | Temp 98.2°F | Resp 16 | Ht 67.5 in | Wt 214.8 lb

## 2023-11-04 DIAGNOSIS — E039 Hypothyroidism, unspecified: Secondary | ICD-10-CM | POA: Diagnosis not present

## 2023-11-04 DIAGNOSIS — E559 Vitamin D deficiency, unspecified: Secondary | ICD-10-CM | POA: Diagnosis not present

## 2023-11-04 DIAGNOSIS — G809 Cerebral palsy, unspecified: Secondary | ICD-10-CM

## 2023-11-04 DIAGNOSIS — Z23 Encounter for immunization: Secondary | ICD-10-CM

## 2023-11-04 DIAGNOSIS — Z1159 Encounter for screening for other viral diseases: Secondary | ICD-10-CM

## 2023-11-04 DIAGNOSIS — Z0000 Encounter for general adult medical examination without abnormal findings: Secondary | ICD-10-CM | POA: Diagnosis not present

## 2023-11-04 DIAGNOSIS — Z114 Encounter for screening for human immunodeficiency virus [HIV]: Secondary | ICD-10-CM | POA: Diagnosis not present

## 2023-11-04 DIAGNOSIS — E669 Obesity, unspecified: Secondary | ICD-10-CM | POA: Diagnosis not present

## 2023-11-04 DIAGNOSIS — Z1211 Encounter for screening for malignant neoplasm of colon: Secondary | ICD-10-CM

## 2023-11-04 LAB — CBC WITH DIFFERENTIAL/PLATELET
Basophils Absolute: 0.1 K/uL (ref 0.0–0.1)
Basophils Relative: 0.9 % (ref 0.0–3.0)
Eosinophils Absolute: 0.2 K/uL (ref 0.0–0.7)
Eosinophils Relative: 2.8 % (ref 0.0–5.0)
HCT: 40.7 % (ref 36.0–46.0)
Hemoglobin: 13.6 g/dL (ref 12.0–15.0)
Lymphocytes Relative: 23.4 % (ref 12.0–46.0)
Lymphs Abs: 1.6 K/uL (ref 0.7–4.0)
MCHC: 33.5 g/dL (ref 30.0–36.0)
MCV: 82.9 fl (ref 78.0–100.0)
Monocytes Absolute: 0.3 K/uL (ref 0.1–1.0)
Monocytes Relative: 4.3 % (ref 3.0–12.0)
Neutro Abs: 4.7 K/uL (ref 1.4–7.7)
Neutrophils Relative %: 68.6 % (ref 43.0–77.0)
Platelets: 293 K/uL (ref 150.0–400.0)
RBC: 4.91 Mil/uL (ref 3.87–5.11)
RDW: 12.8 % (ref 11.5–15.5)
WBC: 6.8 K/uL (ref 4.0–10.5)

## 2023-11-04 LAB — BASIC METABOLIC PANEL WITH GFR
BUN: 10 mg/dL (ref 6–23)
CO2: 29 meq/L (ref 19–32)
Calcium: 8.9 mg/dL (ref 8.4–10.5)
Chloride: 105 meq/L (ref 96–112)
Creatinine, Ser: 0.85 mg/dL (ref 0.40–1.20)
GFR: 77.66 mL/min (ref >60.00)
Glucose, Bld: 78 mg/dL (ref 70–99)
Potassium: 4 meq/L (ref 3.5–5.1)
Sodium: 141 meq/L (ref 135–145)

## 2023-11-04 LAB — LIPID PANEL
Cholesterol: 185 mg/dL (ref 0–200)
HDL: 45 mg/dL (ref >39.00)
LDL Cholesterol: 110 mg/dL — ABNORMAL HIGH (ref 0–99)
NonHDL: 140.09
Total CHOL/HDL Ratio: 4
Triglycerides: 150 mg/dL — ABNORMAL HIGH (ref 0.0–149.0)
VLDL: 30 mg/dL (ref 0.0–40.0)

## 2023-11-04 LAB — HEPATIC FUNCTION PANEL
ALT: 12 U/L (ref 0–35)
AST: 17 U/L (ref 0–37)
Albumin: 4.1 g/dL (ref 3.5–5.2)
Alkaline Phosphatase: 62 U/L (ref 39–117)
Bilirubin, Direct: 0.1 mg/dL (ref 0.0–0.3)
Total Bilirubin: 0.6 mg/dL (ref 0.2–1.2)
Total Protein: 7 g/dL (ref 6.0–8.3)

## 2023-11-04 LAB — VITAMIN D 25 HYDROXY (VIT D DEFICIENCY, FRACTURES): VITD: 53.87 ng/mL (ref 30.00–100.00)

## 2023-11-04 LAB — TSH: TSH: 1.69 u[IU]/mL (ref 0.35–5.50)

## 2023-11-04 NOTE — Patient Instructions (Addendum)
 Follow up in 1 year or as needed We'll notify you of your lab results and make any changes if needed Complete and return the cologuard as directed Schedule your mammogram at your convenience We'll work on your walker Call with any questions or concerns Stay Safe!  Stay Healthy! Happy Fall!!

## 2023-11-04 NOTE — Assessment & Plan Note (Signed)
 Pt's PE unchanged and WNL w/ exception of BMI and known CP spasticity.  Plans to schedule mammo.  Cologuard ordered.  UTD on Tdap.  PNA and flu given today.  Check labs.  Anticipatory guidance provided.

## 2023-11-04 NOTE — Progress Notes (Signed)
   Subjective:    Patient ID: Holly Saunders, female    DOB: 1970-09-10, 53 y.o.   MRN: [id number]  HPI CPE- due for colonoscopy, unable to get to bathroom due to CP but open to idea of cologuard.  Plans to schedule mammo.  UTD on Tdap  Patient Care Team    Relationship Specialty Notifications Start End  Leida Luton E, MD PCP - General Family Medicine  06/18/11    Comment: Lavell Kiang, Norleen SAILOR, MD Consulting Physician Gastroenterology  05/05/18      Health Maintenance  Topic Date Due  . HIV Screening  Never done  . Hepatitis C Screening  Never done  . Hepatitis B Vaccines 19-59 Average Risk (1 of 3 - 19+ 3-dose series) Never done  . Colonoscopy  02/03/2019  . Pneumococcal Vaccine: 50+ Years (1 of 1 - PCV) Never done  . MAMMOGRAM  04/24/2022  . Medicare Annual Wellness (AWV)  01/30/2023  . INFLUENZA VACCINE  10/02/2023  . COVID-19 Vaccine (3 - 2025-26 season) 11/02/2023  . Zoster Vaccines- Shingrix (1 of 2) 02/03/2024 (Originally 05/29/2019)  . DTaP/Tdap/Td (3 - Td or Tdap) 03/24/2027  . HPV VACCINES  Aged Out  . Meningococcal B Vaccine  Aged Out      Review of Systems Patient reports no vision/ hearing changes, adenopathy,fever, weight change,  persistant/recurrent hoarseness , swallowing issues, chest pain, palpitations, edema, persistant/recurrent cough, hemoptysis, dyspnea (rest/exertional/paroxysmal nocturnal), gastrointestinal bleeding (melena, rectal bleeding), abdominal pain, significant heartburn, bowel changes, GU symptoms (dysuria, hematuria, incontinence), Gyn symptoms (abnormal  bleeding, pain),  syncope, focal weakness, memory loss, numbness & tingling, skin/hair/nail changes, abnormal bruising or bleeding, anxiety, or depression.     Objective:   Physical Exam General Appearance:    Alert, cooperative, no distress, appears stated age  Head:    Normocephalic, without obvious abnormality, atraumatic  Eyes:    PERRL, conjunctiva/corneas clear, EOM's intact both  eyes  Ears:    Normal TM's and external ear canals, both ears  Nose:   Nares normal, septum midline, mucosa normal, no drainage    or sinus tenderness  Throat:   Lips, mucosa, and tongue normal; teeth and gums normal  Neck:   Supple, symmetrical, trachea midline, no adenopathy;    Thyroid : no enlargement/tenderness/nodules  Back:     Symmetric, no curvature, ROM normal, no CVA tenderness  Lungs:     Clear to auscultation bilaterally, respirations unlabored  Chest Wall:    No tenderness or deformity   Heart:    Regular rate and rhythm, S1 and S2 normal, no murmur, rub   or gallop  Breast Exam:    Deferred to mammo  Abdomen:     Soft, non-tender, bowel sounds active all four quadrants,    no masses, no organomegaly  Genitalia:    Deferred  Rectal:    Extremities:   Extremities normal, atraumatic, no cyanosis or edema  Pulses:   2+ and symmetric all extremities  Skin:   Skin color, texture, turgor normal, no rashes or lesions  Lymph nodes:   Cervical, supraclavicular, and axillary nodes normal  Neurologic:   CNII-XII intact          Assessment & Plan:

## 2023-11-05 ENCOUNTER — Ambulatory Visit: Payer: Self-pay | Admitting: Family Medicine

## 2023-11-05 LAB — HIV ANTIBODY (ROUTINE TESTING W REFLEX): HIV 1&2 Ab, 4th Generation: NONREACTIVE

## 2023-11-05 LAB — HEPATITIS C ANTIBODY: Hepatitis C Ab: NONREACTIVE

## 2023-11-25 DIAGNOSIS — Z1211 Encounter for screening for malignant neoplasm of colon: Secondary | ICD-10-CM | POA: Diagnosis not present

## 2023-11-29 LAB — COLOGUARD: COLOGUARD: NEGATIVE

## 2023-12-01 NOTE — Progress Notes (Signed)
 Called patient to relay lab results. Left vm to return call

## 2023-12-16 ENCOUNTER — Other Ambulatory Visit: Payer: Self-pay | Admitting: Family Medicine

## 2023-12-16 DIAGNOSIS — E559 Vitamin D deficiency, unspecified: Secondary | ICD-10-CM

## 2023-12-16 NOTE — Telephone Encounter (Signed)
 Patient is requesting Vit D. Last visit was 11/04/23 and last Vit D level was checked 11/04/23 53.87

## 2023-12-23 DIAGNOSIS — F322 Major depressive disorder, single episode, severe without psychotic features: Secondary | ICD-10-CM | POA: Diagnosis not present

## 2024-01-05 ENCOUNTER — Other Ambulatory Visit: Payer: Self-pay | Admitting: Family

## 2024-01-05 ENCOUNTER — Other Ambulatory Visit: Payer: Self-pay | Admitting: Family Medicine

## 2024-01-05 DIAGNOSIS — R7989 Other specified abnormal findings of blood chemistry: Secondary | ICD-10-CM

## 2024-01-05 MED ORDER — LEVOTHYROXINE SODIUM 50 MCG PO TABS
50.0000 ug | ORAL_TABLET | Freq: Every day | ORAL | 1 refills | Status: AC
Start: 2024-01-05 — End: ?

## 2024-01-20 ENCOUNTER — Telehealth: Payer: Self-pay

## 2024-01-20 NOTE — Telephone Encounter (Signed)
 Sent to PCP ?

## 2024-01-20 NOTE — Telephone Encounter (Signed)
 I don't know where to send the order for the new walker.  The previous message said send to insurance company but I don't have a way to contact her insurance company.  Do they want me to send the prescription to a DME company who will then submit to insurance?  I'm happy to send a prescription wherever it needs to go, I just don't know how to proceed

## 2024-01-21 NOTE — Telephone Encounter (Signed)
 Pt has been advised to have order faxed to PCP for her to fill out

## 2024-02-09 ENCOUNTER — Other Ambulatory Visit: Payer: Self-pay

## 2024-02-09 DIAGNOSIS — N3946 Mixed incontinence: Secondary | ICD-10-CM

## 2024-02-09 MED ORDER — OXYBUTYNIN CHLORIDE ER 10 MG PO TB24: 10.0000 mg | ORAL_TABLET | Freq: Every day | ORAL | 3 refills | Status: AC

## 2024-02-16 ENCOUNTER — Telehealth: Payer: Self-pay | Admitting: Family Medicine

## 2024-02-16 NOTE — Telephone Encounter (Signed)
 Copied from CRM #[id number]. Topic: Referral - Request for Referral >> Feb 16, 2024  2:10 PM Suzen RAMAN wrote: Did the patient discuss referral with their provider in the last year? Yes (If No - schedule appointment) (If Yes - send message)  Appointment offered? No  Type of order/referral and detailed reason for visit: Mammogram,routine  Preference of office, provider, location: [facility]  If referral order, have you been seen by this specialty before? Yes (If Yes, this issue or another issue? When? Where?  Can we respond through MyChart? Yes

## 2024-02-17 NOTE — Telephone Encounter (Signed)
 Okay to order mamm for patient?

## 2024-02-17 NOTE — Addendum Note (Signed)
 Addended by: Sahith Nurse E on: 02/17/2024 06:55 PM   Modules accepted: Orders

## 2024-02-17 NOTE — Telephone Encounter (Signed)
 Mammo ordered for [facility]

## 2024-02-18 NOTE — Telephone Encounter (Signed)
 Called to inform mother Sharman, no answer, LM to call back so we can inform her about this as well as discuss phone note in her own chart.

## 2024-02-19 NOTE — Telephone Encounter (Signed)
 Called to inform mother Sharman of order, no answer, LM to call back

## 2024-02-22 NOTE — Telephone Encounter (Signed)
 Called patient and left vm to return call about mammo order

## 2024-04-04 ENCOUNTER — Inpatient Hospital Stay (HOSPITAL_BASED_OUTPATIENT_CLINIC_OR_DEPARTMENT_OTHER): Admission: RE | Admit: 2024-04-04 | Source: Ambulatory Visit

## 2024-04-18 ENCOUNTER — Ambulatory Visit (HOSPITAL_BASED_OUTPATIENT_CLINIC_OR_DEPARTMENT_OTHER)
